# Patient Record
Sex: Female | Born: 1935 | Race: Black or African American | Hispanic: No | Marital: Married | Smoking: Never smoker
Health system: Southern US, Community
[De-identification: ages and names within clinical notes are randomized; demographics above are authoritative.]

## PROBLEM LIST (undated history)

## (undated) DIAGNOSIS — I10 Essential (primary) hypertension: Secondary | ICD-10-CM

## (undated) DIAGNOSIS — R51 Headache: Secondary | ICD-10-CM

## (undated) DIAGNOSIS — R519 Headache, unspecified: Secondary | ICD-10-CM

## (undated) DIAGNOSIS — E876 Hypokalemia: Secondary | ICD-10-CM

## (undated) DIAGNOSIS — M1990 Unspecified osteoarthritis, unspecified site: Secondary | ICD-10-CM

## (undated) HISTORY — PX: OTHER SURGICAL HISTORY: SHX169

## (undated) HISTORY — PX: NASAL SINUS SURGERY: SHX719

## (undated) HISTORY — PX: TUBAL LIGATION: SHX77

## (undated) HISTORY — PX: CATARACT EXTRACTION: SUR2

---

## 1997-05-25 ENCOUNTER — Other Ambulatory Visit: Admission: RE | Admit: 1997-05-25 | Discharge: 1997-05-25 | Payer: Self-pay | Admitting: Nephrology

## 1997-10-04 ENCOUNTER — Emergency Department (HOSPITAL_COMMUNITY): Admission: EM | Admit: 1997-10-04 | Discharge: 1997-10-04 | Payer: Self-pay | Admitting: Emergency Medicine

## 1997-10-10 ENCOUNTER — Encounter: Admission: RE | Admit: 1997-10-10 | Discharge: 1997-10-10 | Payer: Self-pay | Admitting: Hematology and Oncology

## 1997-12-18 ENCOUNTER — Encounter: Admission: RE | Admit: 1997-12-18 | Discharge: 1997-12-18 | Payer: Self-pay | Admitting: Internal Medicine

## 1998-01-18 ENCOUNTER — Encounter: Admission: RE | Admit: 1998-01-18 | Discharge: 1998-01-18 | Payer: Self-pay | Admitting: Hematology and Oncology

## 1998-02-15 ENCOUNTER — Encounter: Admission: RE | Admit: 1998-02-15 | Discharge: 1998-02-15 | Payer: Self-pay | Admitting: Internal Medicine

## 1998-02-26 ENCOUNTER — Ambulatory Visit (HOSPITAL_COMMUNITY): Admission: RE | Admit: 1998-02-26 | Discharge: 1998-02-26 | Payer: Self-pay | Admitting: *Deleted

## 1998-03-01 ENCOUNTER — Encounter: Admission: RE | Admit: 1998-03-01 | Discharge: 1998-03-01 | Payer: Self-pay | Admitting: Hematology and Oncology

## 1998-03-08 ENCOUNTER — Ambulatory Visit (HOSPITAL_COMMUNITY): Admission: RE | Admit: 1998-03-08 | Discharge: 1998-03-08 | Payer: Self-pay | Admitting: Internal Medicine

## 1998-04-06 ENCOUNTER — Encounter: Admission: RE | Admit: 1998-04-06 | Discharge: 1998-04-06 | Payer: Self-pay | Admitting: Internal Medicine

## 1998-05-09 ENCOUNTER — Encounter: Admission: RE | Admit: 1998-05-09 | Discharge: 1998-05-09 | Payer: Self-pay | Admitting: Hematology and Oncology

## 1998-05-30 ENCOUNTER — Emergency Department (HOSPITAL_COMMUNITY): Admission: EM | Admit: 1998-05-30 | Discharge: 1998-05-30 | Payer: Self-pay | Admitting: Emergency Medicine

## 1998-06-07 ENCOUNTER — Encounter: Admission: RE | Admit: 1998-06-07 | Discharge: 1998-06-07 | Payer: Self-pay | Admitting: Internal Medicine

## 1998-07-11 ENCOUNTER — Encounter: Admission: RE | Admit: 1998-07-11 | Discharge: 1998-07-11 | Payer: Self-pay | Admitting: Hematology and Oncology

## 1998-07-12 ENCOUNTER — Encounter: Admission: RE | Admit: 1998-07-12 | Discharge: 1998-07-12 | Payer: Self-pay | Admitting: Hematology and Oncology

## 1998-07-12 ENCOUNTER — Ambulatory Visit (HOSPITAL_COMMUNITY): Admission: RE | Admit: 1998-07-12 | Discharge: 1998-07-12 | Payer: Self-pay | Admitting: Hematology and Oncology

## 1998-07-26 ENCOUNTER — Encounter: Admission: RE | Admit: 1998-07-26 | Discharge: 1998-07-26 | Payer: Self-pay | Admitting: Internal Medicine

## 1998-08-21 ENCOUNTER — Encounter: Admission: RE | Admit: 1998-08-21 | Discharge: 1998-08-21 | Payer: Self-pay | Admitting: Internal Medicine

## 1998-09-04 ENCOUNTER — Encounter: Admission: RE | Admit: 1998-09-04 | Discharge: 1998-09-04 | Payer: Self-pay | Admitting: Internal Medicine

## 1998-09-25 ENCOUNTER — Encounter: Admission: RE | Admit: 1998-09-25 | Discharge: 1998-09-25 | Payer: Self-pay | Admitting: Hematology and Oncology

## 1999-01-19 ENCOUNTER — Encounter: Payer: Self-pay | Admitting: Emergency Medicine

## 1999-01-19 ENCOUNTER — Emergency Department (HOSPITAL_COMMUNITY): Admission: EM | Admit: 1999-01-19 | Discharge: 1999-01-20 | Payer: Self-pay | Admitting: Emergency Medicine

## 1999-03-09 ENCOUNTER — Emergency Department (HOSPITAL_COMMUNITY): Admission: EM | Admit: 1999-03-09 | Discharge: 1999-03-09 | Payer: Self-pay | Admitting: *Deleted

## 1999-10-21 ENCOUNTER — Encounter: Payer: Self-pay | Admitting: Emergency Medicine

## 1999-10-21 ENCOUNTER — Inpatient Hospital Stay (HOSPITAL_COMMUNITY): Admission: EM | Admit: 1999-10-21 | Discharge: 1999-10-23 | Payer: Self-pay | Admitting: Emergency Medicine

## 1999-10-25 ENCOUNTER — Encounter: Admission: RE | Admit: 1999-10-25 | Discharge: 1999-10-25 | Payer: Self-pay | Admitting: Internal Medicine

## 1999-12-26 ENCOUNTER — Emergency Department (HOSPITAL_COMMUNITY): Admission: EM | Admit: 1999-12-26 | Discharge: 1999-12-26 | Payer: Self-pay | Admitting: *Deleted

## 2000-06-27 ENCOUNTER — Emergency Department (HOSPITAL_COMMUNITY): Admission: EM | Admit: 2000-06-27 | Discharge: 2000-06-27 | Payer: Self-pay | Admitting: *Deleted

## 2000-07-24 ENCOUNTER — Encounter: Payer: Self-pay | Admitting: Emergency Medicine

## 2000-07-24 ENCOUNTER — Emergency Department (HOSPITAL_COMMUNITY): Admission: EM | Admit: 2000-07-24 | Discharge: 2000-07-24 | Payer: Self-pay | Admitting: Emergency Medicine

## 2000-08-16 ENCOUNTER — Emergency Department (HOSPITAL_COMMUNITY): Admission: EM | Admit: 2000-08-16 | Discharge: 2000-08-16 | Payer: Self-pay | Admitting: Emergency Medicine

## 2000-09-08 ENCOUNTER — Encounter: Admission: RE | Admit: 2000-09-08 | Discharge: 2000-12-07 | Payer: Self-pay | Admitting: Internal Medicine

## 2001-09-29 ENCOUNTER — Emergency Department (HOSPITAL_COMMUNITY): Admission: EM | Admit: 2001-09-29 | Discharge: 2001-09-29 | Payer: Self-pay | Admitting: Emergency Medicine

## 2001-09-29 ENCOUNTER — Encounter: Payer: Self-pay | Admitting: Emergency Medicine

## 2001-12-31 ENCOUNTER — Emergency Department (HOSPITAL_COMMUNITY): Admission: EM | Admit: 2001-12-31 | Discharge: 2001-12-31 | Payer: Self-pay

## 2002-01-07 ENCOUNTER — Emergency Department (HOSPITAL_COMMUNITY): Admission: EM | Admit: 2002-01-07 | Discharge: 2002-01-07 | Payer: Self-pay | Admitting: Emergency Medicine

## 2002-01-14 ENCOUNTER — Emergency Department (HOSPITAL_COMMUNITY): Admission: EM | Admit: 2002-01-14 | Discharge: 2002-01-14 | Payer: Self-pay | Admitting: Emergency Medicine

## 2002-05-20 ENCOUNTER — Emergency Department (HOSPITAL_COMMUNITY): Admission: EM | Admit: 2002-05-20 | Discharge: 2002-05-20 | Payer: Self-pay

## 2002-10-16 ENCOUNTER — Emergency Department (HOSPITAL_COMMUNITY): Admission: EM | Admit: 2002-10-16 | Discharge: 2002-10-16 | Payer: Self-pay | Admitting: Emergency Medicine

## 2002-10-17 ENCOUNTER — Emergency Department (HOSPITAL_COMMUNITY): Admission: EM | Admit: 2002-10-17 | Discharge: 2002-10-17 | Payer: Self-pay

## 2002-10-17 ENCOUNTER — Emergency Department (HOSPITAL_COMMUNITY): Admission: EM | Admit: 2002-10-17 | Discharge: 2002-10-17 | Payer: Self-pay | Admitting: Emergency Medicine

## 2002-10-21 ENCOUNTER — Encounter: Payer: Self-pay | Admitting: Emergency Medicine

## 2002-10-21 ENCOUNTER — Emergency Department (HOSPITAL_COMMUNITY): Admission: EM | Admit: 2002-10-21 | Discharge: 2002-10-21 | Payer: Self-pay | Admitting: Emergency Medicine

## 2002-10-30 ENCOUNTER — Emergency Department (HOSPITAL_COMMUNITY): Admission: EM | Admit: 2002-10-30 | Discharge: 2002-10-30 | Payer: Self-pay | Admitting: Emergency Medicine

## 2002-10-30 ENCOUNTER — Emergency Department (HOSPITAL_COMMUNITY): Admission: EM | Admit: 2002-10-30 | Discharge: 2002-10-30 | Payer: Self-pay | Admitting: *Deleted

## 2002-10-31 ENCOUNTER — Encounter: Payer: Self-pay | Admitting: Emergency Medicine

## 2002-10-31 ENCOUNTER — Ambulatory Visit (HOSPITAL_COMMUNITY): Admission: RE | Admit: 2002-10-31 | Discharge: 2002-10-31 | Payer: Self-pay | Admitting: Emergency Medicine

## 2002-11-03 ENCOUNTER — Emergency Department (HOSPITAL_COMMUNITY): Admission: EM | Admit: 2002-11-03 | Discharge: 2002-11-03 | Payer: Self-pay | Admitting: Emergency Medicine

## 2002-11-26 ENCOUNTER — Emergency Department (HOSPITAL_COMMUNITY): Admission: EM | Admit: 2002-11-26 | Discharge: 2002-11-26 | Payer: Self-pay | Admitting: Emergency Medicine

## 2002-11-27 ENCOUNTER — Encounter: Payer: Self-pay | Admitting: Emergency Medicine

## 2002-11-27 ENCOUNTER — Emergency Department (HOSPITAL_COMMUNITY): Admission: AD | Admit: 2002-11-27 | Discharge: 2002-11-27 | Payer: Self-pay | Admitting: Emergency Medicine

## 2003-07-05 ENCOUNTER — Ambulatory Visit (HOSPITAL_COMMUNITY): Admission: RE | Admit: 2003-07-05 | Discharge: 2003-07-05 | Payer: Self-pay | Admitting: Family Medicine

## 2003-11-27 ENCOUNTER — Ambulatory Visit: Payer: Self-pay | Admitting: Family Medicine

## 2003-12-11 ENCOUNTER — Ambulatory Visit: Payer: Self-pay | Admitting: Family Medicine

## 2003-12-25 ENCOUNTER — Ambulatory Visit: Payer: Self-pay | Admitting: *Deleted

## 2003-12-27 ENCOUNTER — Ambulatory Visit: Payer: Self-pay | Admitting: Family Medicine

## 2004-01-15 ENCOUNTER — Ambulatory Visit: Payer: Self-pay | Admitting: Family Medicine

## 2004-03-13 ENCOUNTER — Ambulatory Visit: Payer: Self-pay | Admitting: Family Medicine

## 2004-04-17 ENCOUNTER — Ambulatory Visit: Payer: Self-pay | Admitting: Family Medicine

## 2004-05-23 ENCOUNTER — Ambulatory Visit: Payer: Self-pay | Admitting: Family Medicine

## 2004-08-25 ENCOUNTER — Emergency Department (HOSPITAL_COMMUNITY): Admission: EM | Admit: 2004-08-25 | Discharge: 2004-08-25 | Payer: Self-pay | Admitting: Emergency Medicine

## 2004-08-26 ENCOUNTER — Emergency Department (HOSPITAL_COMMUNITY): Admission: EM | Admit: 2004-08-26 | Discharge: 2004-08-26 | Payer: Self-pay | Admitting: Emergency Medicine

## 2004-09-13 ENCOUNTER — Ambulatory Visit: Payer: Self-pay | Admitting: Family Medicine

## 2004-09-26 ENCOUNTER — Ambulatory Visit: Payer: Self-pay | Admitting: Internal Medicine

## 2004-09-26 ENCOUNTER — Ambulatory Visit: Payer: Self-pay | Admitting: Family Medicine

## 2004-09-27 ENCOUNTER — Encounter: Admission: RE | Admit: 2004-09-27 | Discharge: 2004-09-27 | Payer: Self-pay | Admitting: Family Medicine

## 2004-10-03 ENCOUNTER — Ambulatory Visit: Payer: Self-pay | Admitting: Family Medicine

## 2004-10-17 ENCOUNTER — Ambulatory Visit: Payer: Self-pay | Admitting: Family Medicine

## 2004-10-24 ENCOUNTER — Ambulatory Visit: Payer: Self-pay | Admitting: Family Medicine

## 2004-11-07 ENCOUNTER — Ambulatory Visit: Payer: Self-pay | Admitting: Family Medicine

## 2004-11-21 ENCOUNTER — Ambulatory Visit: Payer: Self-pay | Admitting: Family Medicine

## 2004-11-28 ENCOUNTER — Emergency Department (HOSPITAL_COMMUNITY): Admission: EM | Admit: 2004-11-28 | Discharge: 2004-11-28 | Payer: Self-pay | Admitting: Emergency Medicine

## 2005-01-21 ENCOUNTER — Ambulatory Visit: Payer: Self-pay | Admitting: Family Medicine

## 2005-02-28 ENCOUNTER — Emergency Department (HOSPITAL_COMMUNITY): Admission: EM | Admit: 2005-02-28 | Discharge: 2005-02-28 | Payer: Self-pay | Admitting: *Deleted

## 2005-03-06 ENCOUNTER — Ambulatory Visit: Payer: Self-pay | Admitting: Family Medicine

## 2005-05-22 ENCOUNTER — Ambulatory Visit: Payer: Self-pay | Admitting: Family Medicine

## 2005-06-25 ENCOUNTER — Ambulatory Visit: Payer: Self-pay | Admitting: Family Medicine

## 2006-01-05 ENCOUNTER — Ambulatory Visit: Payer: Self-pay | Admitting: Family Medicine

## 2006-04-25 ENCOUNTER — Emergency Department (HOSPITAL_COMMUNITY): Admission: EM | Admit: 2006-04-25 | Discharge: 2006-04-25 | Payer: Self-pay | Admitting: Emergency Medicine

## 2006-05-05 ENCOUNTER — Emergency Department (HOSPITAL_COMMUNITY): Admission: EM | Admit: 2006-05-05 | Discharge: 2006-05-05 | Payer: Self-pay | Admitting: Emergency Medicine

## 2006-05-19 ENCOUNTER — Ambulatory Visit: Payer: Self-pay | Admitting: Family Medicine

## 2006-06-02 ENCOUNTER — Emergency Department (HOSPITAL_COMMUNITY): Admission: EM | Admit: 2006-06-02 | Discharge: 2006-06-02 | Payer: Self-pay | Admitting: Emergency Medicine

## 2006-07-07 ENCOUNTER — Emergency Department (HOSPITAL_COMMUNITY): Admission: EM | Admit: 2006-07-07 | Discharge: 2006-07-07 | Payer: Self-pay | Admitting: Emergency Medicine

## 2006-08-15 DIAGNOSIS — N959 Unspecified menopausal and perimenopausal disorder: Secondary | ICD-10-CM | POA: Insufficient documentation

## 2006-08-15 DIAGNOSIS — I10 Essential (primary) hypertension: Secondary | ICD-10-CM

## 2006-08-15 DIAGNOSIS — J309 Allergic rhinitis, unspecified: Secondary | ICD-10-CM | POA: Insufficient documentation

## 2006-08-15 DIAGNOSIS — R519 Headache, unspecified: Secondary | ICD-10-CM | POA: Insufficient documentation

## 2006-08-15 DIAGNOSIS — R51 Headache: Secondary | ICD-10-CM

## 2006-08-15 DIAGNOSIS — F411 Generalized anxiety disorder: Secondary | ICD-10-CM

## 2006-08-15 DIAGNOSIS — M47812 Spondylosis without myelopathy or radiculopathy, cervical region: Secondary | ICD-10-CM

## 2006-08-15 DIAGNOSIS — L659 Nonscarring hair loss, unspecified: Secondary | ICD-10-CM | POA: Insufficient documentation

## 2006-09-01 ENCOUNTER — Ambulatory Visit: Payer: Self-pay | Admitting: Family Medicine

## 2006-09-15 ENCOUNTER — Ambulatory Visit: Payer: Self-pay | Admitting: Family Medicine

## 2007-04-08 ENCOUNTER — Emergency Department (HOSPITAL_COMMUNITY): Admission: EM | Admit: 2007-04-08 | Discharge: 2007-04-08 | Payer: Self-pay | Admitting: Emergency Medicine

## 2007-04-15 ENCOUNTER — Ambulatory Visit: Payer: Self-pay | Admitting: Family Medicine

## 2007-08-17 ENCOUNTER — Emergency Department (HOSPITAL_COMMUNITY): Admission: EM | Admit: 2007-08-17 | Discharge: 2007-08-17 | Payer: Self-pay | Admitting: Emergency Medicine

## 2007-09-10 ENCOUNTER — Ambulatory Visit: Payer: Self-pay | Admitting: Family Medicine

## 2007-09-10 LAB — CONVERTED CEMR LAB
ALT: 15 units/L (ref 0–35)
AST: 15 units/L (ref 0–37)
Albumin: 5 g/dL (ref 3.5–5.2)
Alkaline Phosphatase: 63 units/L (ref 39–117)
BUN: 11 mg/dL (ref 6–23)
Basophils Absolute: 0 10*3/uL (ref 0.0–0.1)
Basophils Relative: 0 % (ref 0–1)
CO2: 24 meq/L (ref 19–32)
Calcium: 10 mg/dL (ref 8.4–10.5)
Chloride: 99 meq/L (ref 96–112)
Cholesterol: 289 mg/dL — ABNORMAL HIGH (ref 0–200)
Creatinine, Ser: 1.08 mg/dL (ref 0.40–1.20)
Eosinophils Absolute: 0.1 10*3/uL (ref 0.0–0.7)
Eosinophils Relative: 1 % (ref 0–5)
Free Thyroxine Index: 1.9 (ref 1.0–3.9)
Glucose, Bld: 85 mg/dL (ref 70–99)
HCT: 39.7 % (ref 36.0–46.0)
HDL: 83 mg/dL (ref >39)
Hemoglobin: 12.9 g/dL (ref 12.0–15.0)
LDL Cholesterol: 171 mg/dL — ABNORMAL HIGH (ref 0–99)
Lymphocytes Relative: 25 % (ref 12–46)
Lymphs Abs: 1.8 10*3/uL (ref 0.7–4.0)
MCHC: 32.5 g/dL (ref 30.0–36.0)
MCV: 96.4 fL (ref 78.0–100.0)
Monocytes Absolute: 0.4 10*3/uL (ref 0.1–1.0)
Monocytes Relative: 6 % (ref 3–12)
Neutro Abs: 4.7 10*3/uL (ref 1.7–7.7)
Neutrophils Relative %: 67 % (ref 43–77)
Platelets: 218 10*3/uL (ref 150–400)
Potassium: 3.6 meq/L (ref 3.5–5.3)
RBC: 4.12 M/uL (ref 3.87–5.11)
RDW: 14.1 % (ref 11.5–15.5)
Sed Rate: 10 mm/hr (ref 0–22)
Sodium: 139 meq/L (ref 135–145)
T3 Uptake Ratio: 27.9 % (ref 22.5–37.0)
T4, Total: 6.9 ug/dL (ref 5.0–12.5)
TSH: 1.341 microintl units/mL (ref 0.350–5.50)
Total Bilirubin: 0.5 mg/dL (ref 0.3–1.2)
Total CHOL/HDL Ratio: 3.5
Total Protein: 7.8 g/dL (ref 6.0–8.3)
Triglycerides: 176 mg/dL — ABNORMAL HIGH (ref <150)
VLDL: 35 mg/dL (ref 0–40)
WBC: 7 10*3/uL (ref 4.0–10.5)

## 2007-11-10 ENCOUNTER — Ambulatory Visit: Payer: Self-pay | Admitting: Family Medicine

## 2007-11-10 LAB — CONVERTED CEMR LAB
ALT: 21 units/L (ref 0–35)
Cholesterol: 191 mg/dL (ref 0–200)
HDL: 93 mg/dL (ref >39)
LDL Cholesterol: 75 mg/dL (ref 0–99)
Total CHOL/HDL Ratio: 2.1
Triglycerides: 114 mg/dL (ref <150)
VLDL: 23 mg/dL (ref 0–40)

## 2008-08-15 ENCOUNTER — Ambulatory Visit: Payer: Self-pay | Admitting: Family Medicine

## 2008-08-15 LAB — CONVERTED CEMR LAB: Microalb, Ur: 1.3 mg/dL (ref 0.00–1.89)

## 2008-09-20 ENCOUNTER — Emergency Department (HOSPITAL_COMMUNITY): Admission: EM | Admit: 2008-09-20 | Discharge: 2008-09-20 | Payer: Self-pay | Admitting: Emergency Medicine

## 2008-11-07 ENCOUNTER — Ambulatory Visit: Payer: Self-pay | Admitting: Family Medicine

## 2008-11-07 LAB — CONVERTED CEMR LAB
ALT: 11 units/L (ref 0–35)
AST: 12 units/L (ref 0–37)
Albumin: 4.6 g/dL (ref 3.5–5.2)
Alkaline Phosphatase: 56 units/L (ref 39–117)
BUN: 10 mg/dL (ref 6–23)
CO2: 23 meq/L (ref 19–32)
Calcium: 9.9 mg/dL (ref 8.4–10.5)
Chloride: 101 meq/L (ref 96–112)
Cholesterol: 259 mg/dL — ABNORMAL HIGH (ref 0–200)
Creatinine, Ser: 1.1 mg/dL (ref 0.40–1.20)
Glucose, Bld: 101 mg/dL — ABNORMAL HIGH (ref 70–99)
HDL: 85 mg/dL (ref >39)
LDL Cholesterol: 143 mg/dL — ABNORMAL HIGH (ref 0–99)
Potassium: 3.3 meq/L — ABNORMAL LOW (ref 3.5–5.3)
Sed Rate: 2 mm/hr (ref 0–22)
Sodium: 139 meq/L (ref 135–145)
TSH: 2.742 microintl units/mL (ref 0.350–4.500)
Total Bilirubin: 0.3 mg/dL (ref 0.3–1.2)
Total CHOL/HDL Ratio: 3
Total Protein: 7 g/dL (ref 6.0–8.3)
Triglycerides: 157 mg/dL — ABNORMAL HIGH (ref <150)
VLDL: 31 mg/dL (ref 0–40)
Vit D, 25-Hydroxy: 14 ng/mL — ABNORMAL LOW (ref 30–89)

## 2008-11-16 ENCOUNTER — Other Ambulatory Visit: Admission: RE | Admit: 2008-11-16 | Discharge: 2008-11-16 | Payer: Self-pay | Admitting: Family Medicine

## 2008-11-16 ENCOUNTER — Encounter (INDEPENDENT_AMBULATORY_CARE_PROVIDER_SITE_OTHER): Payer: Self-pay | Admitting: Family Medicine

## 2008-11-16 ENCOUNTER — Ambulatory Visit: Payer: Self-pay | Admitting: Family Medicine

## 2008-11-24 ENCOUNTER — Ambulatory Visit (HOSPITAL_COMMUNITY): Admission: RE | Admit: 2008-11-24 | Discharge: 2008-11-24 | Payer: Self-pay | Admitting: Family Medicine

## 2009-01-15 ENCOUNTER — Ambulatory Visit: Payer: Self-pay | Admitting: Family Medicine

## 2009-01-16 ENCOUNTER — Encounter (INDEPENDENT_AMBULATORY_CARE_PROVIDER_SITE_OTHER): Payer: Self-pay | Admitting: Family Medicine

## 2009-01-16 LAB — CONVERTED CEMR LAB
ALT: 11 units/L (ref 0–35)
BUN: 9 mg/dL (ref 6–23)
CO2: 26 meq/L (ref 19–32)
Calcium: 9.8 mg/dL (ref 8.4–10.5)
Chloride: 90 meq/L — ABNORMAL LOW (ref 96–112)
Cholesterol: 248 mg/dL — ABNORMAL HIGH (ref 0–200)
Creatinine, Ser: 1.05 mg/dL (ref 0.40–1.20)
Glucose, Bld: 111 mg/dL — ABNORMAL HIGH (ref 70–99)
HDL: 88 mg/dL (ref >39)
LDL Cholesterol: 138 mg/dL — ABNORMAL HIGH (ref 0–99)
Potassium: 3.2 meq/L — ABNORMAL LOW (ref 3.5–5.3)
Sodium: 130 meq/L — ABNORMAL LOW (ref 135–145)
Total CHOL/HDL Ratio: 2.8
Triglycerides: 110 mg/dL (ref <150)
VLDL: 22 mg/dL (ref 0–40)

## 2010-06-23 LAB — CBC
HCT: 35.9 % — ABNORMAL LOW (ref 36.0–46.0)
Hemoglobin: 12.5 g/dL (ref 12.0–15.0)
MCHC: 34.7 g/dL (ref 30.0–36.0)
MCV: 95.5 fL (ref 78.0–100.0)
Platelets: 155 10*3/uL (ref 150–400)
RBC: 3.76 MIL/uL — ABNORMAL LOW (ref 3.87–5.11)
RDW: 13.4 % (ref 11.5–15.5)
WBC: 5.3 10*3/uL (ref 4.0–10.5)

## 2010-06-23 LAB — URINE MICROSCOPIC-ADD ON

## 2010-06-23 LAB — COMPREHENSIVE METABOLIC PANEL
ALT: 15 U/L (ref 0–35)
AST: 20 U/L (ref 0–37)
Albumin: 4.4 g/dL (ref 3.5–5.2)
Alkaline Phosphatase: 58 U/L (ref 39–117)
BUN: 14 mg/dL (ref 6–23)
CO2: 30 mEq/L (ref 19–32)
Calcium: 9.7 mg/dL (ref 8.4–10.5)
Chloride: 98 mEq/L (ref 96–112)
Creatinine, Ser: 1.34 mg/dL — ABNORMAL HIGH (ref 0.4–1.2)
GFR calc Af Amer: 46 mL/min — ABNORMAL LOW (ref >60)
GFR calc non Af Amer: 38 mL/min — ABNORMAL LOW (ref >60)
Glucose, Bld: 153 mg/dL — ABNORMAL HIGH (ref 70–99)
Potassium: 3.1 mEq/L — ABNORMAL LOW (ref 3.5–5.1)
Sodium: 135 mEq/L (ref 135–145)
Total Bilirubin: 0.6 mg/dL (ref 0.3–1.2)
Total Protein: 7.1 g/dL (ref 6.0–8.3)

## 2010-06-23 LAB — DIFFERENTIAL
Basophils Absolute: 0 10*3/uL (ref 0.0–0.1)
Basophils Relative: 0 % (ref 0–1)
Eosinophils Absolute: 0.1 10*3/uL (ref 0.0–0.7)
Eosinophils Relative: 1 % (ref 0–5)
Lymphocytes Relative: 22 % (ref 12–46)
Lymphs Abs: 1.2 10*3/uL (ref 0.7–4.0)
Monocytes Absolute: 0.4 10*3/uL (ref 0.1–1.0)
Monocytes Relative: 8 % (ref 3–12)
Neutro Abs: 3.7 10*3/uL (ref 1.7–7.7)
Neutrophils Relative %: 69 % (ref 43–77)

## 2010-06-23 LAB — URINALYSIS, ROUTINE W REFLEX MICROSCOPIC
Bilirubin Urine: NEGATIVE
Glucose, UA: NEGATIVE mg/dL
Hgb urine dipstick: NEGATIVE
Ketones, ur: NEGATIVE mg/dL
Nitrite: NEGATIVE
Protein, ur: NEGATIVE mg/dL
Specific Gravity, Urine: 1.008 (ref 1.005–1.030)
Urobilinogen, UA: 0.2 mg/dL (ref 0.0–1.0)
pH: 7 (ref 5.0–8.0)

## 2010-07-06 ENCOUNTER — Emergency Department (HOSPITAL_COMMUNITY): Payer: Medicare Other

## 2010-07-06 ENCOUNTER — Emergency Department (HOSPITAL_COMMUNITY)
Admission: EM | Admit: 2010-07-06 | Discharge: 2010-07-07 | Disposition: A | Discharge status: Home / Self Care | Payer: Medicare Other | Attending: Emergency Medicine | Admitting: Emergency Medicine

## 2010-07-06 DIAGNOSIS — E7800 Pure hypercholesterolemia, unspecified: Secondary | ICD-10-CM | POA: Insufficient documentation

## 2010-07-06 DIAGNOSIS — I10 Essential (primary) hypertension: Secondary | ICD-10-CM | POA: Insufficient documentation

## 2010-07-06 DIAGNOSIS — R635 Abnormal weight gain: Secondary | ICD-10-CM | POA: Insufficient documentation

## 2010-07-06 DIAGNOSIS — F411 Generalized anxiety disorder: Secondary | ICD-10-CM | POA: Insufficient documentation

## 2010-07-06 DIAGNOSIS — R609 Edema, unspecified: Secondary | ICD-10-CM | POA: Insufficient documentation

## 2010-07-06 LAB — COMPREHENSIVE METABOLIC PANEL
CO2: 29 mEq/L (ref 19–32)
Calcium: 9.5 mg/dL (ref 8.4–10.5)
Creatinine, Ser: 1.57 mg/dL — ABNORMAL HIGH (ref 0.4–1.2)
GFR calc Af Amer: 38 mL/min — ABNORMAL LOW (ref >60)
GFR calc non Af Amer: 32 mL/min — ABNORMAL LOW (ref >60)
Glucose, Bld: 107 mg/dL — ABNORMAL HIGH (ref 70–99)

## 2010-07-06 LAB — DIFFERENTIAL
Basophils Absolute: 0 10*3/uL (ref 0.0–0.1)
Basophils Relative: 1 % (ref 0–1)
Monocytes Absolute: 0.6 10*3/uL (ref 0.1–1.0)
Neutro Abs: 3.1 10*3/uL (ref 1.7–7.7)

## 2010-07-06 LAB — URINALYSIS, ROUTINE W REFLEX MICROSCOPIC
Ketones, ur: NEGATIVE mg/dL
Nitrite: NEGATIVE
Protein, ur: NEGATIVE mg/dL

## 2010-07-06 LAB — POCT CARDIAC MARKERS
Myoglobin, poc: 61.1 ng/mL (ref 12–200)
Troponin i, poc: <0.05 ng/mL (ref 0.00–0.09)

## 2010-07-06 LAB — CBC
Hemoglobin: 12 g/dL (ref 12.0–15.0)
MCHC: 34.1 g/dL (ref 30.0–36.0)
RDW: 12.9 % (ref 11.5–15.5)
WBC: 6 10*3/uL (ref 4.0–10.5)

## 2010-07-06 LAB — PROTIME-INR: Prothrombin Time: 12.8 seconds (ref 11.6–15.2)

## 2010-07-06 LAB — APTT: aPTT: 37 seconds (ref 24–37)

## 2010-07-09 LAB — URINE CULTURE: Culture  Setup Time: 201204221125

## 2010-08-02 NOTE — Discharge Summary (Signed)
[place]. [facility]  Patient:    Adrienne Bright, Adrienne Bright                     MRN: [id number] Adm. Date:  [id number] Disc. Date: [id number] Attending:  Madaline Guthrie Dictator:   Blanch Media, M.D.                           Discharge Summary  RESIDENTS: 1. ______. 2. Blanch Media, M.D.  DISCHARGE DIAGNOSES: DD:  11/08/99 TD:  11/11/99 Job: [id number]

## 2010-09-19 ENCOUNTER — Encounter: Payer: Medicare Other | Attending: Neurosurgery | Admitting: Dietician

## 2010-09-19 DIAGNOSIS — I10 Essential (primary) hypertension: Secondary | ICD-10-CM | POA: Insufficient documentation

## 2010-09-19 DIAGNOSIS — Z713 Dietary counseling and surveillance: Secondary | ICD-10-CM | POA: Insufficient documentation

## 2010-09-19 DIAGNOSIS — E785 Hyperlipidemia, unspecified: Secondary | ICD-10-CM | POA: Insufficient documentation

## 2012-06-08 ENCOUNTER — Emergency Department (HOSPITAL_COMMUNITY)
Admission: EM | Admit: 2012-06-08 | Discharge: 2012-06-08 | Disposition: A | Discharge status: Home / Self Care | Payer: Medicare Other | Attending: Emergency Medicine | Admitting: Emergency Medicine

## 2012-06-08 ENCOUNTER — Emergency Department (HOSPITAL_COMMUNITY): Payer: Medicare Other

## 2012-06-08 DIAGNOSIS — R05 Cough: Secondary | ICD-10-CM

## 2012-06-08 DIAGNOSIS — R059 Cough, unspecified: Secondary | ICD-10-CM | POA: Insufficient documentation

## 2012-06-08 DIAGNOSIS — E876 Hypokalemia: Secondary | ICD-10-CM | POA: Insufficient documentation

## 2012-06-08 DIAGNOSIS — Z79899 Other long term (current) drug therapy: Secondary | ICD-10-CM | POA: Insufficient documentation

## 2012-06-08 DIAGNOSIS — R509 Fever, unspecified: Secondary | ICD-10-CM | POA: Insufficient documentation

## 2012-06-08 LAB — BASIC METABOLIC PANEL
BUN: 9 mg/dL (ref 6–23)
CO2: 27 mEq/L (ref 19–32)
Calcium: 9.5 mg/dL (ref 8.4–10.5)
Creatinine, Ser: 0.88 mg/dL (ref 0.50–1.10)

## 2012-06-08 LAB — CBC
MCH: 31.6 pg (ref 26.0–34.0)
MCV: 92.8 fL (ref 78.0–100.0)
Platelets: 208 10*3/uL (ref 150–400)
RBC: 4.02 MIL/uL (ref 3.87–5.11)

## 2012-06-08 MED ORDER — GUAIFENESIN ER 1200 MG PO TB12: 1.0000 | ORAL_TABLET | Freq: Two times a day (BID) | ORAL | Status: DC

## 2012-06-08 MED ORDER — POTASSIUM CHLORIDE CRYS ER 20 MEQ PO TBCR
40.0000 meq | EXTENDED_RELEASE_TABLET | Freq: Once | ORAL | Status: AC
  Administered 2012-06-08: 40 meq via ORAL
  Filled 2012-06-08: qty 2

## 2012-06-08 MED ORDER — BENZONATATE 100 MG PO CAPS: 100.0000 mg | ORAL_CAPSULE | Freq: Three times a day (TID) | ORAL | Status: DC

## 2012-06-08 NOTE — ED Provider Notes (Signed)
History     CSN: [id number]  Arrival date & time 06/08/12  1740   First MD Initiated Contact with Patient 06/08/12 1821      Chief Complaint  Patient presents with  . Cough    Patient is a 79 y.o. female presenting with cough. The history is provided by the patient.  Cough Cough characteristics:  Dry Severity:  Moderate Onset quality:  Gradual Duration: The symptoms started this morning. Timing:  Intermittent Chronicity:  New Smoker: no   Relieved by:  Nothing Worsened by:  Nothing tried Associated symptoms: chills and fever   Associated symptoms: no rash and no shortness of breath   SHe did not check her temperature today but she felt like she had a fever.  When she coughs her chest does burn.  No history of DVT, PE or heart failure.    No past medical history on file.  No past surgical history on file.  No family history on file.  History  Substance Use Topics  . Smoking status: Not on file  . Smokeless tobacco: Not on file  . Alcohol Use: Not on file    OB History   No data available      Review of Systems  Constitutional: Positive for fever and chills.  Respiratory: Positive for cough. Negative for shortness of breath.   Skin: Negative for rash.  All other systems reviewed and are negative.    Allergies  Amlodipine besylate; Benazepril hcl; Propranolol hcl; and Trandolapril  Home Medications   Current Outpatient Rx  Name  Route  Sig  Dispense  Refill  . amLODipine (NORVASC) 10 MG tablet   Oral   Take 10 mg by mouth every morning.         . butalbital-acetaminophen-caffeine (FIORICET, ESGIC) 50-325-40 MG per tablet   Oral   Take 1 tablet by mouth 2 (two) times daily as needed for headache (headache).         . diazepam (VALIUM) 5 MG tablet   Oral   Take 2.5-5 mg by mouth every 6 (six) hours as needed (anxiety).         . furosemide (LASIX) 20 MG tablet   Oral   Take 20 mg by mouth every morning.         Marland Kitchen lisinopril  (PRINIVIL,ZESTRIL) 20 MG tablet   Oral   Take 20 mg by mouth every morning.         . metoprolol (LOPRESSOR) 50 MG tablet   Oral   Take 50 mg by mouth 2 (two) times daily.         . benzonatate (TESSALON) 100 MG capsule   Oral   Take 1 capsule (100 mg total) by mouth every 8 (eight) hours.   21 capsule   0   . Guaifenesin 1200 MG TB12   Oral   Take 1 tablet (1,200 mg total) by mouth 2 (two) times daily at 10 AM and 5 PM.   14 each   0     BP 174/80  Pulse 86  Temp(Src) 98.6 F (37 C) (Oral)  Resp 19  SpO2 100%  Physical Exam  Nursing note and vitals reviewed. Constitutional: She appears well-developed and well-nourished. No distress.  HENT:  Head: Normocephalic and atraumatic.  Right Ear: External ear normal.  Left Ear: External ear normal.  Eyes: Conjunctivae are normal. Right eye exhibits no discharge. Left eye exhibits no discharge. No scleral icterus.  Neck: Neck supple. No tracheal deviation present.  Cardiovascular: Normal rate, regular rhythm and intact distal pulses.   Pulmonary/Chest: Effort normal and breath sounds normal. No stridor. No respiratory distress. She has no wheezes. She has no rales.  Abdominal: Soft. Bowel sounds are normal. She exhibits no distension. There is no tenderness. There is no rebound and no guarding.  Musculoskeletal: She exhibits no edema and no tenderness.  Neurological: She is alert. She has normal strength. No sensory deficit. Cranial nerve deficit:  no gross defecits noted. She exhibits normal muscle tone. She displays no seizure activity. Coordination normal.  Skin: Skin is warm and dry. No rash noted.  Psychiatric: She has a normal mood and affect.    ED Course  Procedures (including critical care time) Monitor Sinus rhythm, rate 79, nl intervals, nl t waves, narrow qrs  Medications  potassium chloride SA (K-DUR,KLOR-CON) CR tablet 40 mEq (not administered)    Labs Reviewed  CBC - Abnormal; Notable for the  following:    WBC 11.7 (*)    All other components within normal limits  BASIC METABOLIC PANEL - Abnormal; Notable for the following:    Potassium 3.1 (*)    Glucose, Bld 102 (*)    GFR calc non Af Amer 60 (*)    GFR calc Af Amer 69 (*)    All other components within normal limits   Dg Chest 2 View  06/08/2012  *RADIOLOGY REPORT*  Clinical Data: Cough and congestion.  CHEST - 2 VIEW  Comparison: 07/06/2010  Findings: The cardiac silhouette, mediastinal and hilar contours are normal and stable.  There is tortuosity and calcification of the thoracic aorta.  The lungs are clear.  No pleural effusion. The bony thorax is intact.  IMPRESSION: No acute cardiopulmonary findings.   Original Report Authenticated By: Rudie Meyer, M.D.      1. Cough   2. Hypokalemia       MDM  Pt appears well in the ED.  No coughing, dyspnea or fever.  Suspect viral etiology.  Will treat with guaifenisin, tessalon.  Close follow up with PCP.  Precautions and warning signs discussed.       Celene Kras, MD 06/08/12 2004

## 2012-06-08 NOTE — ED Notes (Addendum)
Patient reports cough starting this morning. HA from cough, "burning in throat". Patient reports having chills and sweats. Patient reports feeling febrile but never taking temperature. Patient reports taking Fioricet for HA

## 2012-06-23 IMAGING — CR DG CHEST 2V
2 series · 2 of 2 positions shown · non-contrast
Comparison: Chest radiograph performed 09/20/2008

CLINICAL DATA: Weight gain, sensation of bloating and bilateral
lower leg swelling.

CHEST - 2 VIEW

[w chest pa]
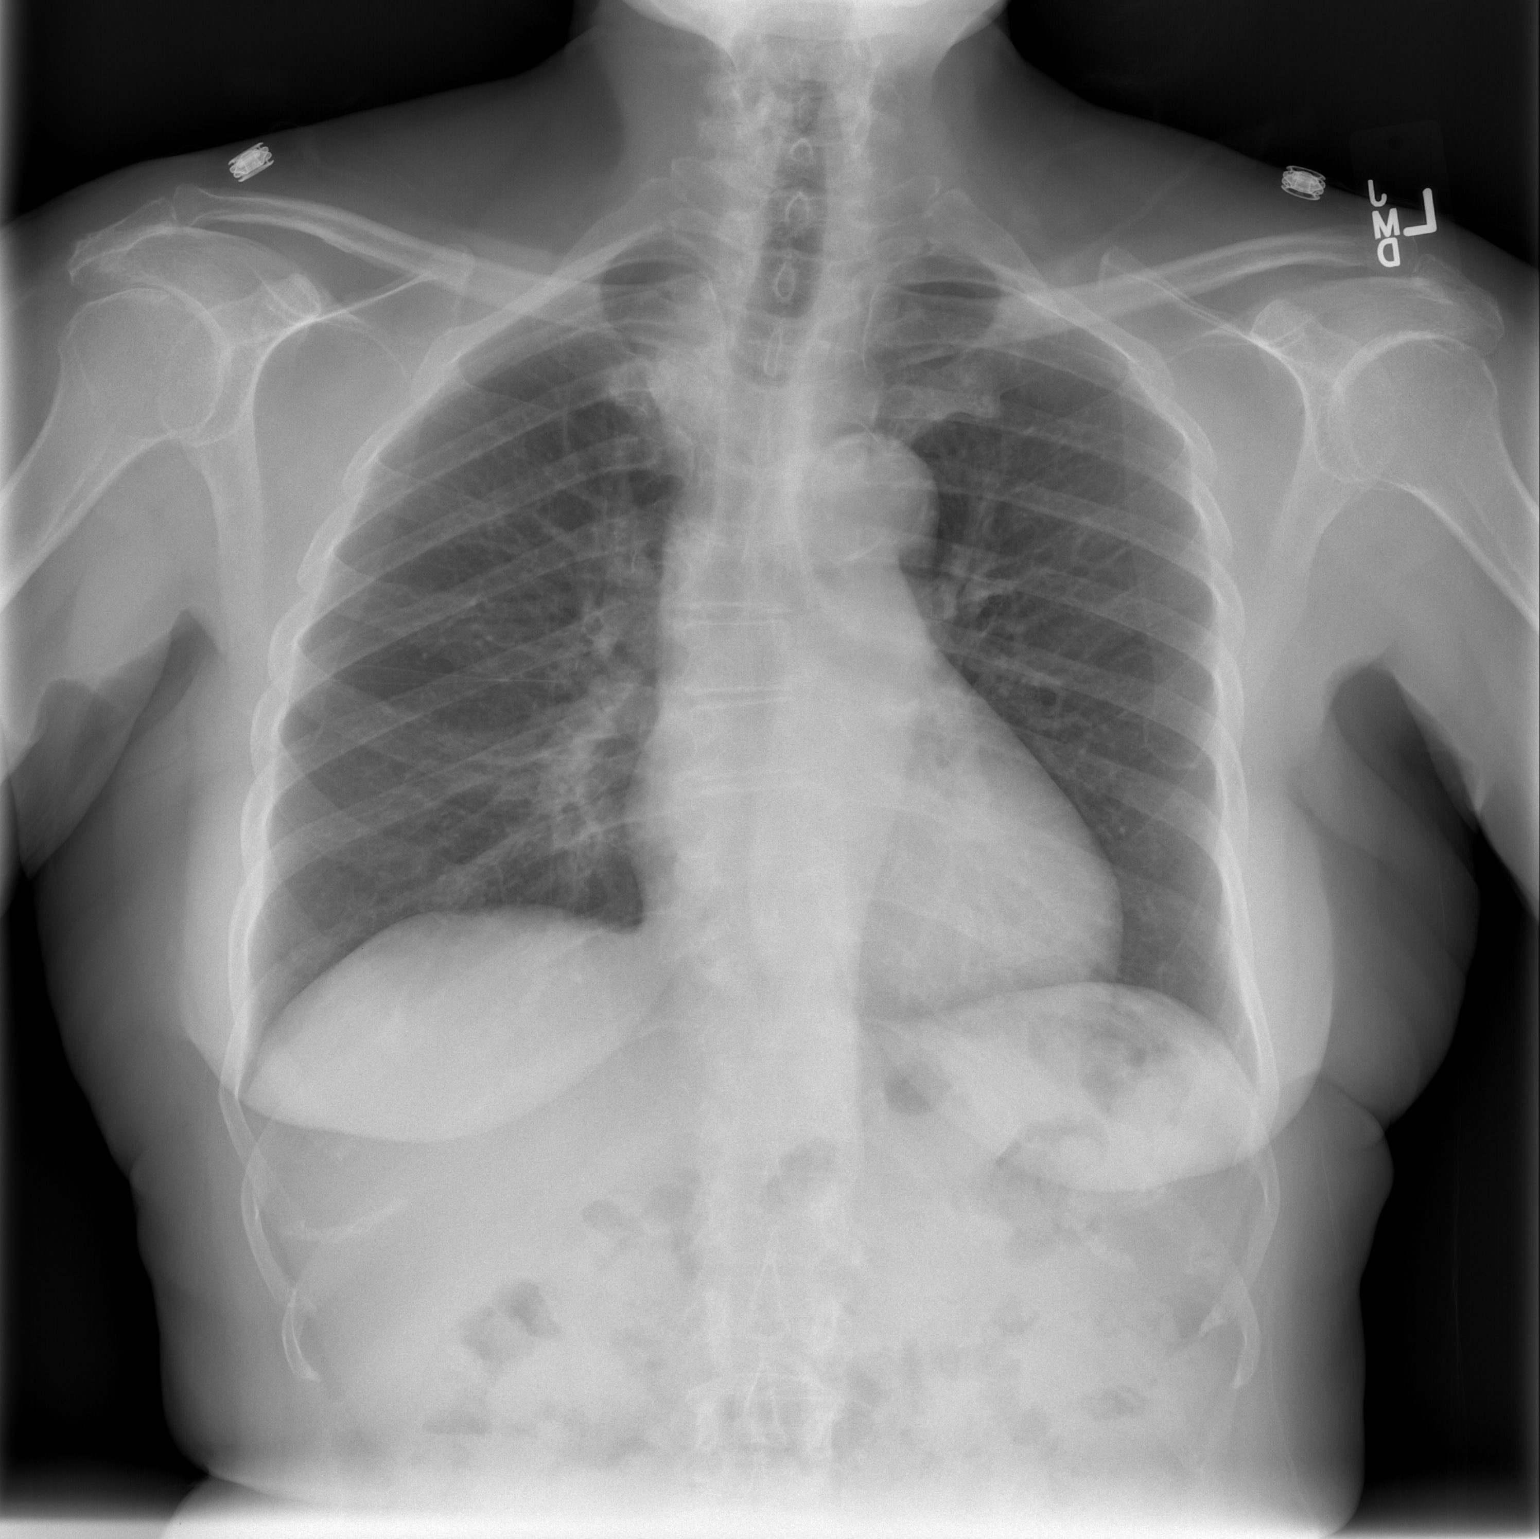

[w chest lat]
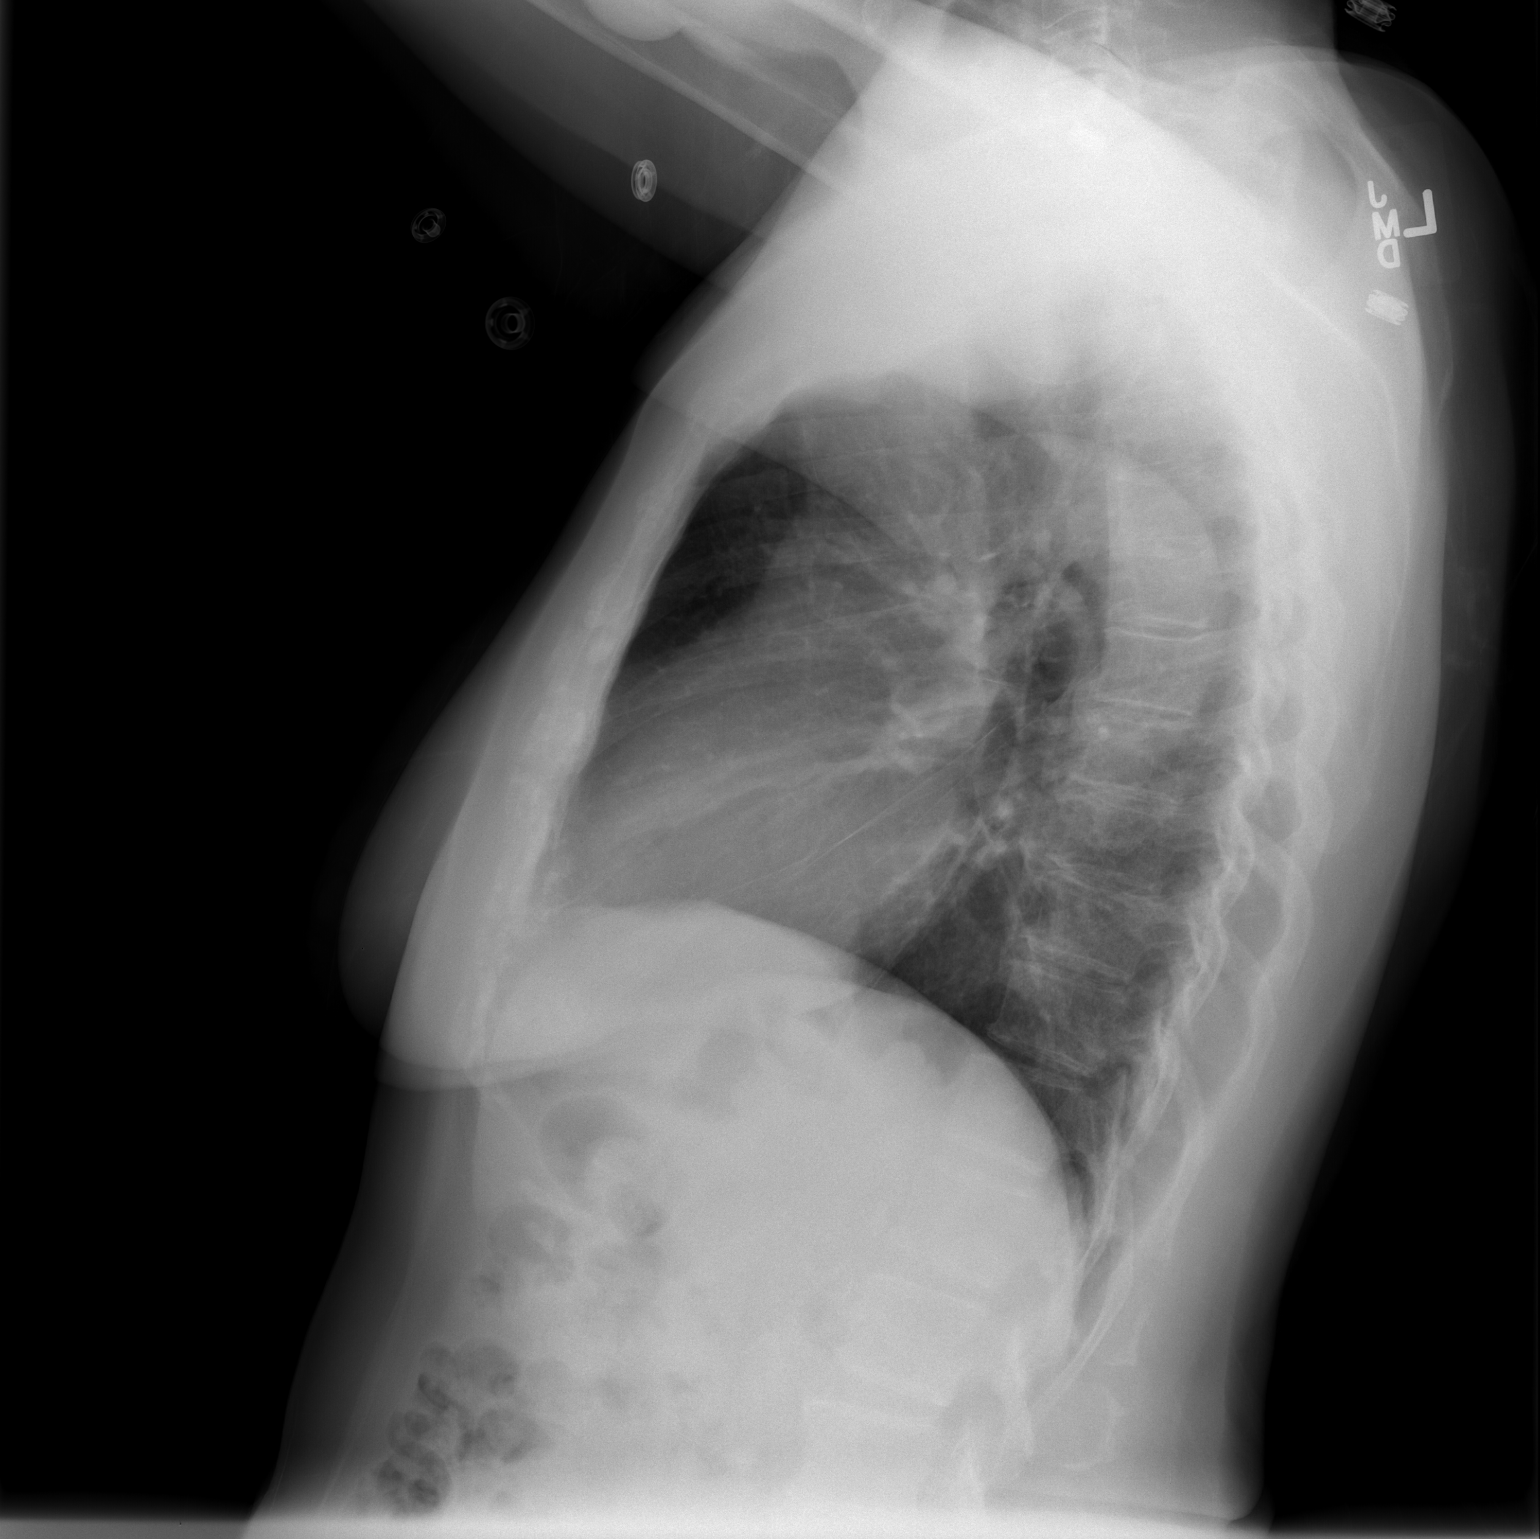

[2 of 2 positions shown; findings below may reference images not displayed]

FINDINGS: The lungs are well-aerated.  Peribronchial thickening is
noted.  There is no evidence of focal opacification, pleural
effusion or pneumothorax.

The heart is normal in size; calcification is noted within the
aortic arch.  Stable prominence of the right paratracheal soft
tissues likely reflects vasculature.  No acute osseous
abnormalities are seen.
IMPRESSION: Peribronchial thickening; lungs otherwise clear.

## 2013-08-13 ENCOUNTER — Emergency Department (HOSPITAL_COMMUNITY)
Admission: EM | Admit: 2013-08-13 | Discharge: 2013-08-13 | Disposition: A | Discharge status: Home / Self Care | Payer: Medicare Other | Attending: Emergency Medicine | Admitting: Emergency Medicine

## 2013-08-13 ENCOUNTER — Emergency Department (HOSPITAL_COMMUNITY): Payer: Medicare Other

## 2013-08-13 ENCOUNTER — Encounter (HOSPITAL_COMMUNITY): Payer: Self-pay | Admitting: Emergency Medicine

## 2013-08-13 DIAGNOSIS — Z8639 Personal history of other endocrine, nutritional and metabolic disease: Secondary | ICD-10-CM | POA: Insufficient documentation

## 2013-08-13 DIAGNOSIS — Z79899 Other long term (current) drug therapy: Secondary | ICD-10-CM | POA: Insufficient documentation

## 2013-08-13 DIAGNOSIS — E876 Hypokalemia: Secondary | ICD-10-CM

## 2013-08-13 DIAGNOSIS — Z862 Personal history of diseases of the blood and blood-forming organs and certain disorders involving the immune mechanism: Secondary | ICD-10-CM | POA: Insufficient documentation

## 2013-08-13 DIAGNOSIS — I10 Essential (primary) hypertension: Secondary | ICD-10-CM | POA: Insufficient documentation

## 2013-08-13 DIAGNOSIS — R600 Localized edema: Secondary | ICD-10-CM

## 2013-08-13 DIAGNOSIS — R609 Edema, unspecified: Secondary | ICD-10-CM | POA: Insufficient documentation

## 2013-08-13 HISTORY — DX: Headache, unspecified: R51.9

## 2013-08-13 HISTORY — DX: Hypercalcemia: E83.52

## 2013-08-13 HISTORY — DX: Essential (primary) hypertension: I10

## 2013-08-13 HISTORY — DX: Hypokalemia: E87.6

## 2013-08-13 HISTORY — DX: Headache: R51

## 2013-08-13 LAB — COMPREHENSIVE METABOLIC PANEL
ALK PHOS: 87 U/L (ref 39–117)
ALT: 10 U/L (ref 0–35)
AST: 14 U/L (ref 0–37)
Albumin: 4.8 g/dL (ref 3.5–5.2)
BILIRUBIN TOTAL: 0.4 mg/dL (ref 0.3–1.2)
BUN: 10 mg/dL (ref 6–23)
CO2: 25 meq/L (ref 19–32)
Calcium: 10.1 mg/dL (ref 8.4–10.5)
Chloride: 95 mEq/L — ABNORMAL LOW (ref 96–112)
Creatinine, Ser: 0.91 mg/dL (ref 0.50–1.10)
GFR, EST AFRICAN AMERICAN: 66 mL/min — AB (ref >90)
GFR, EST NON AFRICAN AMERICAN: 57 mL/min — AB (ref >90)
GLUCOSE: 97 mg/dL (ref 70–99)
POTASSIUM: 3 meq/L — AB (ref 3.7–5.3)
SODIUM: 134 meq/L — AB (ref 137–147)
Total Protein: 7.9 g/dL (ref 6.0–8.3)

## 2013-08-13 LAB — CBC
HEMATOCRIT: 38.8 % (ref 36.0–46.0)
HEMOGLOBIN: 12.7 g/dL (ref 12.0–15.0)
MCH: 31.1 pg (ref 26.0–34.0)
MCHC: 32.7 g/dL (ref 30.0–36.0)
MCV: 94.9 fL (ref 78.0–100.0)
Platelets: 190 10*3/uL (ref 150–400)
RBC: 4.09 MIL/uL (ref 3.87–5.11)
RDW: 13.5 % (ref 11.5–15.5)
WBC: 6.5 10*3/uL (ref 4.0–10.5)

## 2013-08-13 LAB — TROPONIN I: Troponin I: <0.3 ng/mL (ref <0.30)

## 2013-08-13 LAB — PRO B NATRIURETIC PEPTIDE: Pro B Natriuretic peptide (BNP): 115.3 pg/mL (ref 0–450)

## 2013-08-13 MED ORDER — POTASSIUM CHLORIDE CRYS ER 20 MEQ PO TBCR
40.0000 meq | EXTENDED_RELEASE_TABLET | Freq: Once | ORAL | Status: AC
Start: 2013-08-13 — End: 2013-08-13
  Administered 2013-08-13: 40 meq via ORAL
  Filled 2013-08-13: qty 2

## 2013-08-13 NOTE — ED Provider Notes (Signed)
CSN: [id number]     Arrival date & time 08/13/13  1440 History   First MD Initiated Contact with Patient 08/13/13 1537     Chief Complaint  Patient presents with  . Leg Swelling     (Consider location/radiation/quality/duration/timing/severity/associated sxs/prior Treatment) The history is provided by the patient.  pt c/o bilateral ankle and lower leg swelling for the past 2-3 months, worse in past couple weeks. Constant. No acute/abrupt change today. Bilateral, no worsening unilateral swelling, or leg pain. Hx same. States takes lasix for same but doesn't think is helping. Is urinating of normal frequency. No orthopnea or pnd. No current or recent cp or discomfort.  Denies recent change in meds or doses, states compliant w normal meds. Pt unaware of recent wt change. No recent change in diet/foods.  Denies hx liver disease or kidney disease.     Past Medical History  Diagnosis Date  . Hypertension   . Hypercalcemia   . Headache   . Hypokalemia    History reviewed. No pertinent past surgical history. No family history on file. History  Substance Use Topics  . Smoking status: Not on file  . Smokeless tobacco: Current User    Types: Snuff  . Alcohol Use: No   OB History   Grav Para Term Preterm Abortions TAB SAB Ect Mult Living                 Review of Systems  Constitutional: Negative for fever and chills.  HENT: Negative for sore throat.   Eyes: Negative for redness.  Respiratory: Negative for cough and shortness of breath.   Cardiovascular: Positive for leg swelling. Negative for chest pain.  Gastrointestinal: Negative for vomiting, abdominal pain and diarrhea.  Genitourinary: Negative for flank pain.  Musculoskeletal: Negative for back pain and neck pain.  Skin: Negative for rash.  Neurological: Negative for headaches.  Hematological: Does not bruise/bleed easily.  Psychiatric/Behavioral: Negative for confusion.      Allergies  Amlodipine besylate; Benazepril  hcl; Propranolol hcl; and Trandolapril  Home Medications   Prior to Admission medications   Medication Sig Start Date End Date Taking? Authorizing Provider  amLODipine (NORVASC) 10 MG tablet Take 10 mg by mouth every morning.    Historical Provider, MD  benzonatate (TESSALON) 100 MG capsule Take 1 capsule (100 mg total) by mouth every 8 (eight) hours. 06/08/12   Dorie Rank, MD  butalbital-acetaminophen-caffeine (FIORICET, ESGIC) [phone] MG per tablet Take 1 tablet by mouth 2 (two) times daily as needed for headache (headache).    Historical Provider, MD  diazepam (VALIUM) 5 MG tablet Take 2.5-5 mg by mouth every 6 (six) hours as needed (anxiety).    Historical Provider, MD  furosemide (LASIX) 20 MG tablet Take 20 mg by mouth every morning.    Historical Provider, MD  Guaifenesin 1200 MG TB12 Take 1 tablet (1,200 mg total) by mouth 2 (two) times daily at 10 AM and 5 PM. 06/08/12   Dorie Rank, MD  lisinopril (PRINIVIL,ZESTRIL) 20 MG tablet Take 20 mg by mouth every morning.    Historical Provider, MD  metoprolol (LOPRESSOR) 50 MG tablet Take 50 mg by mouth 2 (two) times daily.    Historical Provider, MD   BP 152/84  Pulse 115  Temp(Src) 98.7 F (37.1 C) (Oral)  Resp 18  SpO2 96% Physical Exam  Nursing note and vitals reviewed. Constitutional: She is oriented to person, place, and time. She appears well-developed and well-nourished. No distress.  HENT:  Mouth/Throat: Oropharynx  is clear and moist.  Eyes: Conjunctivae are normal. No scleral icterus.  Neck: Neck supple. No JVD present. No tracheal deviation present.  Cardiovascular: Normal rate, regular rhythm, normal heart sounds and intact distal pulses.   Pulmonary/Chest: Effort normal and breath sounds normal. No respiratory distress.  Abdominal: Soft. Normal appearance and bowel sounds are normal. She exhibits no distension and no mass. There is no tenderness. There is no rebound and no guarding.  Genitourinary:  No cva tenderness   Musculoskeletal: Normal range of motion. She exhibits edema. She exhibits no tenderness.  1-2+ bilateral, symmetric, foot/ankle/lower leg edema. Distal pulses palp.   Neurological: She is alert and oriented to person, place, and time.  Skin: Skin is warm and dry. No rash noted.  Psychiatric: She has a normal mood and affect.    ED Course  Procedures (including critical care time) Labs Review  Results for orders placed during the hospital encounter of 08/13/13  CBC      Result Value Ref Range   WBC 6.5  4.0 - 10.5 K/uL   RBC 4.09  3.87 - 5.11 MIL/uL   Hemoglobin 12.7  12.0 - 15.0 g/dL   HCT 38.8  36.0 - 46.0 %   MCV 94.9  78.0 - 100.0 fL   MCH 31.1  26.0 - 34.0 pg   MCHC 32.7  30.0 - 36.0 g/dL   RDW 13.5  11.5 - 15.5 %   Platelets 190  150 - 400 K/uL  COMPREHENSIVE METABOLIC PANEL      Result Value Ref Range   Sodium 134 (*) 137 - 147 mEq/L   Potassium 3.0 (*) 3.7 - 5.3 mEq/L   Chloride 95 (*) 96 - 112 mEq/L   CO2 25  19 - 32 mEq/L   Glucose, Bld 97  70 - 99 mg/dL   BUN 10  6 - 23 mg/dL   Creatinine, Ser 0.91  0.50 - 1.10 mg/dL   Calcium 10.1  8.4 - 10.5 mg/dL   Total Protein 7.9  6.0 - 8.3 g/dL   Albumin 4.8  3.5 - 5.2 g/dL   AST 14  0 - 37 U/L   ALT 10  0 - 35 U/L   Alkaline Phosphatase 87  39 - 117 U/L   Total Bilirubin 0.4  0.3 - 1.2 mg/dL   GFR calc non Af Amer 57 (*) >90 mL/min   GFR calc Af Amer 66 (*) >90 mL/min  PRO B NATRIURETIC PEPTIDE      Result Value Ref Range   Pro B Natriuretic peptide (BNP) 115.3  0 - 450 pg/mL  TROPONIN I      Result Value Ref Range   Troponin I <0.30  <0.30 ng/mL   Dg Chest 2 View  08/13/2013   CLINICAL DATA:  pain chest pain  EXAM: CHEST  2 VIEW  COMPARISON:  06/08/2012  FINDINGS: Normal cardiac silhouette. There is a density along the right paratracheal upper lobe which is not changed from prior likely represents a benign vascular structure. No effusion, infiltrate, pneumothorax.  IMPRESSION: No acute cardiopulmonary process.  .   No change from prior.   Electronically Signed   By: Suzy Bouchard M.D.   On: 08/13/2013 17:04       EKG Interpretation   Date/Time:  Saturday Aug 13 2013 16:10:45 EDT Ventricular Rate:  89 PR Interval:  168 QRS Duration: 88 QT Interval:  336 QTC Calculation: 409 R Axis:   11 Text Interpretation:  Sinus rhythm Multiform  ventricular premature  complexes Nonspecific T abnormalities, lateral leads No significant change  since last tracing Confirmed by Ashok Cordia  MD, Lennette Bihari ([id number]) on 08/13/2013  4:31:47 PM      MDM  Labs. Cxr.  Reviewed nursing notes and prior charts for additional history.   Mild symmetric bil leg edema x months. No evidence chf.  k low, low in past, kcl po.  Pt no k supplement, will increase and have recheck 1 week.  Discussed leg elevation, limiting salt intake, and close pcp f/u.  Pt appears stable for d/c.      Mirna Mires, MD 08/13/13 [phone]

## 2013-08-13 NOTE — ED Notes (Signed)
Pt. Stated, I've been having leg swelling for 2 weeks, my Dr. Hanley Seamen me some water pills but they don't seem to work.

## 2013-08-13 NOTE — Discharge Instructions (Signed)
Your potassium level is low (3) - eat plenty of fruit and vegetables.  Take one extra of your potassium supplement pills each day for the next week.  Follow up with your doctor for recheck of your potassium level in 1 week. For leg swelling, continue your lasix, elevated legs, and limit salt intake.   Follow up with your doctor in coming week. Return to ER if worse, trouble breathing, worsening or severe leg swelling, one-sided leg swelling and pain, other concern.     Peripheral Edema You have swelling in your legs (peripheral edema). This swelling is due to excess accumulation of salt and water in your body. Edema may be a sign of heart, kidney or liver disease, or a side effect of a medication. It may also be due to problems in the leg veins. Elevating your legs and using special support stockings may be very helpful, if the cause of the swelling is due to poor venous circulation. Avoid long periods of standing, whatever the cause. Treatment of edema depends on identifying the cause. Chips, pretzels, pickles and other salty foods should be avoided. Restricting salt in your diet is almost always needed. Water pills (diuretics) are often used to remove the excess salt and water from your body via urine. These medicines prevent the kidney from reabsorbing sodium. This increases urine flow. Diuretic treatment may also result in lowering of potassium levels in your body. Potassium supplements may be needed if you have to use diuretics daily. Daily weights can help you keep track of your progress in clearing your edema. You should call your caregiver for follow up care as recommended. SEEK IMMEDIATE MEDICAL CARE IF:   You have increased swelling, pain, redness, or heat in your legs.  You develop shortness of breath, especially when lying down.  You develop chest or abdominal pain, weakness, or fainting.  You have a fever. Document Released: 04/10/2004 Document Revised: 05/26/2011 Document Reviewed:  03/21/2009 [facility] Patient Information 2014 [place].      Hypokalemia Hypokalemia means that the amount of potassium in the blood is lower than normal.Potassium is a chemical, called an electrolyte, that helps regulate the amount of fluid in the body. It also stimulates muscle contraction and helps nerves function properly.Most of the body's potassium is inside of cells, and only a very small amount is in the blood. Because the amount in the blood is so small, minor changes can be life-threatening. CAUSES  Antibiotics.  Diarrhea or vomiting.  Using laxatives too much, which can cause diarrhea.  Chronic kidney disease.  Water pills (diuretics).  Eating disorders (bulimia).  Low magnesium level.  Sweating a lot. SIGNS AND SYMPTOMS  Weakness.  Constipation.  Fatigue.  Muscle cramps.  Mental confusion.  Skipped heartbeats or irregular heartbeat (palpitations).  Tingling or numbness. DIAGNOSIS  Your health care provider can diagnose hypokalemia with blood tests. In addition to checking your potassium level, your health care provider may also check other lab tests. TREATMENT Hypokalemia can be treated with potassium supplements taken by mouth or adjustments in your current medicines. If your potassium level is very low, you may need to get potassium through a vein (IV) and be monitored in the hospital. A diet high in potassium is also helpful. Foods high in potassium are:  Nuts, such as peanuts and pistachios.  Seeds, such as sunflower seeds and pumpkin seeds.  Peas, lentils, and lima beans.  Whole grain and bran cereals and breads.  Fresh fruit and vegetables, such as apricots, avocado,  bananas, cantaloupe, kiwi, oranges, tomatoes, asparagus, and potatoes.  Orange and tomato juices.  Red meats.  Fruit yogurt. HOME CARE INSTRUCTIONS  Take all medicines as prescribed by your health care provider.  Maintain a healthy diet by including nutritious  food, such as fruits, vegetables, nuts, whole grains, and lean meats.  If you are taking a laxative, be sure to follow the directions on the label. SEEK MEDICAL CARE IF:  Your weakness gets worse.  You feel your heart pounding or racing.  You are vomiting or having diarrhea.  You are diabetic and having trouble keeping your blood glucose in the normal range. SEEK IMMEDIATE MEDICAL CARE IF:  You have chest pain, shortness of breath, or dizziness.  You are vomiting or having diarrhea for more than 2 days.  You faint. MAKE SURE YOU:   Understand these instructions.  Will watch your condition.  Will get help right away if you are not doing well or get worse. Document Released: 03/03/2005 Document Revised: 12/22/2012 Document Reviewed: 09/03/2012 [facility] Patient Information 2014 [place].    Edema Edema is a buildup of fluids. It is most common in the feet, ankles, and legs. This happens more as a person ages. It may affect one or both legs. HOME CARE   Raise (elevate) the legs or ankles above the level of the heart while lying down.  Avoid sitting or standing still for a long time.  Exercise the legs to help the puffiness (swelling) go down.  A low-salt diet may help lessen the puffiness.  Only take medicine as told by your doctor. GET HELP RIGHT AWAY IF:   You develop shortness of breath or chest pain.  You cannot breathe when you lie down.  You have more puffiness that does not go away with treatment.  You develop pain or redness in the areas that are puffy.  You have a temperature by mouth above 102 F (38.9 C), not controlled by medicine.  You gain 03 lb/1.4 kg or more in 1 day or 05 lb/2.3 kg in a week. MAKE SURE YOU:   Understand these instructions.  Will watch your condition.  Will get help right away if you are not doing well or get worse. Document Released: 08/20/2007 Document Revised: 05/26/2011 Document Reviewed: 08/20/2007 [facility]  Patient Information 2014 [place], [place].

## 2013-08-21 ENCOUNTER — Encounter (HOSPITAL_COMMUNITY): Payer: Self-pay | Admitting: Emergency Medicine

## 2013-08-21 ENCOUNTER — Emergency Department (HOSPITAL_COMMUNITY)
Admission: EM | Admit: 2013-08-21 | Discharge: 2013-08-21 | Disposition: A | Discharge status: Home / Self Care | Payer: Medicare Other | Attending: Emergency Medicine | Admitting: Emergency Medicine

## 2013-08-21 DIAGNOSIS — F17200 Nicotine dependence, unspecified, uncomplicated: Secondary | ICD-10-CM | POA: Insufficient documentation

## 2013-08-21 DIAGNOSIS — I10 Essential (primary) hypertension: Secondary | ICD-10-CM | POA: Insufficient documentation

## 2013-08-21 DIAGNOSIS — Z79899 Other long term (current) drug therapy: Secondary | ICD-10-CM | POA: Insufficient documentation

## 2013-08-21 DIAGNOSIS — R21 Rash and other nonspecific skin eruption: Secondary | ICD-10-CM | POA: Insufficient documentation

## 2013-08-21 DIAGNOSIS — Z888 Allergy status to other drugs, medicaments and biological substances status: Secondary | ICD-10-CM | POA: Insufficient documentation

## 2013-08-21 DIAGNOSIS — M7989 Other specified soft tissue disorders: Secondary | ICD-10-CM | POA: Insufficient documentation

## 2013-08-21 DIAGNOSIS — E876 Hypokalemia: Secondary | ICD-10-CM | POA: Insufficient documentation

## 2013-08-21 NOTE — ED Provider Notes (Signed)
CSN: [id number]     Arrival date & time 08/21/13  4128 History   First MD Initiated Contact with Patient 08/21/13 1005     Chief Complaint  Patient presents with  . Rash  . Leg Swelling     (Consider location/radiation/quality/duration/timing/severity/associated sxs/prior Treatment) HPI  80 year old female with history of hypertension, hyperglycemia, and hypokalemia who presents complaining of a rash.  Patient reports for the past 4 days she has noticed gradual onset of a rash throughout body. The rash initially was first noticed on her neck and now it is everywhere. She described it as multiple red spots throughout her body. Rash is mildly itchy but not painful. No associated fever, headache, throat swelling, chest pain, shortness of breath, wheezing, nausea vomiting or diarrhea. No medication changes, no new pets, no new detergent or soap, and no other change in environment that patient can recall. She has never had this rash before. No one else at home with similar rash. No specific treatment tried. Patient also mentioned that she has bilateral lower extremity swelling ongoing for the past 2 weeks. States she was seen by her primary care Dr. and was placed on a short course of Lasix. She does not think that the leg swelling improved with the medication and therefore she did visit the ER 8 days ago for the same complaints. At that time an evaluation was done but no evidence of CHF, or kidney insufficiency. Patient was recommended to follow with her primary care Dr. Today she makes brief mention of the leg swelling but states that it has not bothered her.  Past Medical History  Diagnosis Date  . Hypertension   . Hypercalcemia   . Headache   . Hypokalemia    History reviewed. No pertinent past surgical history. History reviewed. No pertinent family history. History  Substance Use Topics  . Smoking status: Not on file  . Smokeless tobacco: Current User    Types: Snuff  . Alcohol Use: No    OB History   Grav Para Term Preterm Abortions TAB SAB Ect Mult Living                 Review of Systems  All other systems reviewed and are negative.     Allergies  Amlodipine besylate; Benazepril hcl; Propranolol hcl; and Trandolapril  Home Medications   Prior to Admission medications   Medication Sig Start Date End Date Taking? Authorizing Provider  acetaminophen (TYLENOL) 325 MG tablet Take 650 mg by mouth every 6 (six) hours as needed for mild pain.   Yes Historical Provider, MD  amLODipine (NORVASC) 10 MG tablet Take 5 mg by mouth every morning.    Yes Historical Provider, MD  butalbital-acetaminophen-caffeine (FIORICET, ESGIC) 50-325-40 MG per tablet Take 1 tablet by mouth 2 (two) times daily as needed for headache.   Yes Historical Provider, MD  calcium-vitamin D (OSCAL 500/200 D-3) 500-200 MG-UNIT per tablet Take 1 tablet by mouth 2 (two) times daily.   Yes Historical Provider, MD  diazepam (VALIUM) 5 MG tablet Take 2.5 mg by mouth 2 (two) times daily as needed for anxiety (anxiety).    Yes Historical Provider, MD  furosemide (LASIX) 20 MG tablet Take 20 mg by mouth daily as needed for fluid.    Yes Historical Provider, MD  lisinopril (PRINIVIL,ZESTRIL) 20 MG tablet Take 40 mg by mouth every morning.    Yes Historical Provider, MD  loratadine (CLARITIN) 10 MG tablet Take 10 mg by mouth daily as needed for  allergies.   Yes Historical Provider, MD  metoprolol (LOPRESSOR) 50 MG tablet Take 50 mg by mouth 2 (two) times daily.   Yes Historical Provider, MD  mometasone (NASONEX) 50 MCG/ACT nasal spray Place 2 sprays into the nose daily as needed (allergies).   Yes Historical Provider, MD  omeprazole (PRILOSEC) 20 MG capsule Take 20 mg by mouth daily.   Yes Historical Provider, MD  potassium chloride (MICRO-K) 10 MEQ CR capsule Take 10 mEq by mouth daily. When taking Furosemide   Yes Historical Provider, MD  pravastatin (PRAVACHOL) 40 MG tablet Take 40 mg by mouth daily.   Yes  Historical Provider, MD   BP 164/90  Pulse 78  Temp(Src) 98.8 F (37.1 C) (Oral)  SpO2 98% Physical Exam  Nursing note and vitals reviewed. Constitutional: She is oriented to person, place, and time. She appears well-developed and well-nourished. No distress.  HENT:  Head: Atraumatic.  Mouth/Throat: Oropharynx is clear and moist.  Eyes: Conjunctivae are normal.  Neck: Neck supple.  No nuchal rigidity  Cardiovascular: Normal rate and regular rhythm.   Pulmonary/Chest: Effort normal and breath sounds normal. She has no rales.  Abdominal: Soft. There is no tenderness.  Musculoskeletal: She exhibits no edema and no tenderness.  Neurological: She is alert and oriented to person, place, and time.  Skin: Rash (multiple macular erythematous lesion measuring less than 1 cm are present throughout the body including her neck, upper chest, mid back, bilateral thigh, bilateral forearms. No mucosal involvement and no rash in palms of hands or soles of feet) noted.  Psychiatric: She has a normal mood and affect.    ED Course  Procedures (including critical care time)  Patient with macular erythematous lesions throughout her body. No red flags. No petechiae, pustule, or vesicular lesion noted. No signs of infection. No systemic involvement. No urticaria hives. I suspect the rash will be self-limiting and therefore recommend watchful waiting and followup with her PCP. I believe prednisone, Benadryl, and Pepcid has greater side effect as compared to the benefit that it provides. I recommend using hydrocortisone 1% cream on lesions that bothers her the most but avoid face. Today patient has no evidence of pulmonary edema, no JVD, and no lower extremity swelling. She has a fairly unremarkable workup weeks ago for the pedal edema. Given that she was on Lasix recently, this medication may have caused a rash. Recommend stop taking Lasix and discussed this with her primary care Dr.  Care discussed with Dr.  Tawnya Crook who agrees with plan.    Labs Review Labs Reviewed - No data to display  Imaging Review No results found.   EKG Interpretation None      MDM   Final diagnoses:  Rash and nonspecific skin eruption    BP 130/66  Pulse 61  Temp(Src) 98.8 F (37.1 C) (Oral)  SpO2 99%  I have reviewed nursing notes and vital signs.  I reviewed available ER/hospitalization records thought the EMR     Domenic Moras, [place] 08/21/13 1205

## 2013-08-21 NOTE — Discharge Instructions (Signed)
Please monitor your rash closely.  If you develop fever, throat swelling, trouble breathing then return to ER for further evaluation of your rash.  Otherwise follow up with your doctor.  You may use over the counter hydrocortisone 1% cream and apply to rash as need for itch.  Stop taking Lasix as this may be the offending agent.    Rash A rash is a change in the color or texture of your skin. There are many different types of rashes. You may have other problems that accompany your rash. CAUSES   Infections.  Allergic reactions. This can include allergies to pets or foods.  Certain medicines.  Exposure to certain chemicals, soaps, or cosmetics.  Heat.  Exposure to poisonous plants.  Tumors, both cancerous and noncancerous. SYMPTOMS   Redness.  Scaly skin.  Itchy skin.  Dry or cracked skin.  Bumps.  Blisters.  Pain. DIAGNOSIS  Your caregiver may do a physical exam to determine what type of rash you have. A skin sample (biopsy) may be taken and examined under a microscope. TREATMENT  Treatment depends on the type of rash you have. Your caregiver may prescribe certain medicines. For serious conditions, you may need to see a skin doctor (dermatologist). HOME CARE INSTRUCTIONS   Avoid the substance that caused your rash.  Do not scratch your rash. This can cause infection.  You may take cool baths to help stop itching.  Only take over-the-counter or prescription medicines as directed by your caregiver.  Keep all follow-up appointments as directed by your caregiver. SEEK IMMEDIATE MEDICAL CARE IF:  You have increasing pain, swelling, or redness.  You have a fever.  You have new or severe symptoms.  You have body aches, diarrhea, or vomiting.  Your rash is not better after 3 days. MAKE SURE YOU:  Understand these instructions.  Will watch your condition.  Will get help right away if you are not doing well or get worse. Document Released: 02/21/2002 Document  Revised: 05/26/2011 Document Reviewed: 12/16/2010 [facility] Patient Information 2014 [place], [place].

## 2013-08-21 NOTE — ED Notes (Addendum)
She states her legs are swollen, she was here last week for the same thing and the swelling has not gotten any better. She also noticed this week she has a rash all over her body that itches. Denies any new meds or products. No one in house with same rash.

## 2013-08-22 NOTE — ED Provider Notes (Signed)
Medical screening examination/treatment/procedure(s) were conducted as a shared visit with non-physician practitioner(s) and myself.  I personally evaluated the patient during the encounter. Pt presents w/ pruritic rash, found to have few scattered papules on extremities, neck/trunk. No signs of systemic infection, no clear environmental causes. She has not tried anything on rash. These may be insect bites such as bed bugs. Rec supportive care w/ PO benadyl or lotion, washing of fomites, close PCP f/u.     EKG Interpretation None        Neta Ehlers, MD 08/22/13 1431

## 2013-09-23 ENCOUNTER — Emergency Department (HOSPITAL_COMMUNITY)
Admission: EM | Admit: 2013-09-23 | Discharge: 2013-09-23 | Disposition: A | Discharge status: Home / Self Care | Payer: Medicare Other | Attending: Emergency Medicine | Admitting: Emergency Medicine

## 2013-09-23 ENCOUNTER — Encounter (HOSPITAL_COMMUNITY): Payer: Self-pay | Admitting: Emergency Medicine

## 2013-09-23 DIAGNOSIS — Z8639 Personal history of other endocrine, nutritional and metabolic disease: Secondary | ICD-10-CM | POA: Diagnosis not present

## 2013-09-23 DIAGNOSIS — S90569A Insect bite (nonvenomous), unspecified ankle, initial encounter: Secondary | ICD-10-CM | POA: Insufficient documentation

## 2013-09-23 DIAGNOSIS — Y9389 Activity, other specified: Secondary | ICD-10-CM | POA: Insufficient documentation

## 2013-09-23 DIAGNOSIS — Y92009 Unspecified place in unspecified non-institutional (private) residence as the place of occurrence of the external cause: Secondary | ICD-10-CM | POA: Insufficient documentation

## 2013-09-23 DIAGNOSIS — S1096XA Insect bite of unspecified part of neck, initial encounter: Secondary | ICD-10-CM | POA: Diagnosis not present

## 2013-09-23 DIAGNOSIS — W57XXXA Bitten or stung by nonvenomous insect and other nonvenomous arthropods, initial encounter: Secondary | ICD-10-CM | POA: Insufficient documentation

## 2013-09-23 DIAGNOSIS — Z79899 Other long term (current) drug therapy: Secondary | ICD-10-CM | POA: Insufficient documentation

## 2013-09-23 DIAGNOSIS — Z862 Personal history of diseases of the blood and blood-forming organs and certain disorders involving the immune mechanism: Secondary | ICD-10-CM | POA: Insufficient documentation

## 2013-09-23 DIAGNOSIS — L089 Local infection of the skin and subcutaneous tissue, unspecified: Secondary | ICD-10-CM | POA: Insufficient documentation

## 2013-09-23 DIAGNOSIS — I10 Essential (primary) hypertension: Secondary | ICD-10-CM | POA: Insufficient documentation

## 2013-09-23 DIAGNOSIS — T148 Other injury of unspecified body region: Principal | ICD-10-CM

## 2013-09-23 MED ORDER — HYDROXYZINE HCL 25 MG PO TABS: 25.0000 mg | ORAL_TABLET | Freq: Four times a day (QID) | ORAL | Status: DC

## 2013-09-23 NOTE — ED Provider Notes (Signed)
CSN: [id number]     Arrival date & time 09/23/13  1324 History   First MD Initiated Contact with Patient 09/23/13 1411     Chief Complaint  Patient presents with  . Animal Bite    Bed BUGS     (Consider location/radiation/quality/duration/timing/severity/associated sxs/prior Treatment) HPI Comments: Patient is an 80 year old female who presents with complaints of multiple insect bites to the arms, legs, and neck.  She gets these when she goes to her husband's house.  She brought two of these with her in a medication bottle.  No difficulty breathing or swallowing.  No fevers or chills.  Patient is a 80 y.o. female presenting with animal bite. The history is provided by the patient.  Animal Bite Contact animal:  Insect Location:  Head/neck and shoulder/arm   Past Medical History  Diagnosis Date  . Hypertension   . Hypercalcemia   . Headache   . Hypokalemia    History reviewed. No pertinent past surgical history. No family history on file. History  Substance Use Topics  . Smoking status: Never Smoker   . Smokeless tobacco: Current User    Types: Snuff  . Alcohol Use: No   OB History   Grav Para Term Preterm Abortions TAB SAB Ect Mult Living                 Review of Systems  All other systems reviewed and are negative.     Allergies  Amlodipine besylate; Benazepril hcl; Propranolol hcl; and Trandolapril  Home Medications   Prior to Admission medications   Medication Sig Start Date End Date Taking? Authorizing Provider  acetaminophen (TYLENOL) 325 MG tablet Take 650 mg by mouth every 6 (six) hours as needed for mild pain.    Historical Provider, MD  amLODipine (NORVASC) 10 MG tablet Take 5 mg by mouth every morning.     Historical Provider, MD  butalbital-acetaminophen-caffeine (FIORICET, ESGIC) 50-325-40 MG per tablet Take 1 tablet by mouth 2 (two) times daily as needed for headache.    Historical Provider, MD  calcium-vitamin D (OSCAL 500/200 D-3) 500-200 MG-UNIT  per tablet Take 1 tablet by mouth 2 (two) times daily.    Historical Provider, MD  diazepam (VALIUM) 5 MG tablet Take 2.5 mg by mouth 2 (two) times daily as needed for anxiety (anxiety).     Historical Provider, MD  furosemide (LASIX) 20 MG tablet Take 20 mg by mouth daily as needed for fluid.     Historical Provider, MD  lisinopril (PRINIVIL,ZESTRIL) 20 MG tablet Take 40 mg by mouth every morning.     Historical Provider, MD  loratadine (CLARITIN) 10 MG tablet Take 10 mg by mouth daily as needed for allergies.    Historical Provider, MD  metoprolol (LOPRESSOR) 50 MG tablet Take 50 mg by mouth 2 (two) times daily.    Historical Provider, MD  mometasone (NASONEX) 50 MCG/ACT nasal spray Place 2 sprays into the nose daily as needed (allergies).    Historical Provider, MD  omeprazole (PRILOSEC) 20 MG capsule Take 20 mg by mouth daily.    Historical Provider, MD  potassium chloride (MICRO-K) 10 MEQ CR capsule Take 10 mEq by mouth daily. When taking Furosemide    Historical Provider, MD  pravastatin (PRAVACHOL) 40 MG tablet Take 40 mg by mouth daily.    Historical Provider, MD   BP 163/66  Pulse 102  Temp(Src) 98.3 F (36.8 C) (Oral)  Resp 20  SpO2 97% Physical Exam  Nursing note  and vitals reviewed. Constitutional: She is oriented to person, place, and time. She appears well-developed and well-nourished. No distress.  HENT:  Head: Normocephalic and atraumatic.  Mouth/Throat: Oropharynx is clear and moist.  Neck: Normal range of motion. Neck supple.  Cardiovascular: Normal rate, regular rhythm and normal heart sounds.   No murmur heard. Pulmonary/Chest: Effort normal and breath sounds normal. She exhibits no tenderness.  Abdominal: Soft. Bowel sounds are normal.  Neurological: She is alert and oriented to person, place, and time.  Skin: Skin is warm and dry. She is not diaphoretic.  There are multiple, small, raised, pruritic lesions to the arms, legs, and neck.      ED Course  Procedures  (including critical care time) Labs Review Labs Reviewed - No data to display  Imaging Review No results found.   EKG Interpretation None      MDM   Final diagnoses:  None    Will treat with hydroxyzine, prn follow up.  Bugs don't appear to be bedbugs, are not ticks.    Veryl Speak, MD 09/23/13 1501

## 2013-09-23 NOTE — ED Notes (Signed)
Pt states that she has being bite while sleeping and has brought in these 2 little bugs and appears to be bedbugs.  Pt states it makes her break out in red spots.  Pt states it makes her body hurt and feels weak

## 2013-09-23 NOTE — Discharge Instructions (Signed)
Atarax as needed for itching.  Return to the ER for difficulty breathing, increased redness or swelling, or other new and concerning symptoms.   Insect Bite Mosquitoes, flies, fleas, bedbugs, and many other insects can bite. Insect bites are different from insect stings. A sting is when venom is injected into the skin. Some insect bites can transmit infectious diseases. SYMPTOMS  Insect bites usually turn red, swell, and itch for 2 to 4 days. They often go away on their own. TREATMENT  Your caregiver may prescribe antibiotic medicines if a bacterial infection develops in the bite. HOME CARE INSTRUCTIONS  Do not scratch the bite area.  Keep the bite area clean and dry. Wash the bite area thoroughly with soap and water.  Put ice or cool compresses on the bite area.  Put ice in a plastic bag.  Place a towel between your skin and the bag.  Leave the ice on for 20 minutes, 4 times a day for the first 2 to 3 days, or as directed.  You may apply a baking soda paste, cortisone cream, or calamine lotion to the bite area as directed by your caregiver. This can help reduce itching and swelling.  Only take over-the-counter or prescription medicines as directed by your caregiver.  If you are given antibiotics, take them as directed. Finish them even if you start to feel better. You may need a tetanus shot if:  You cannot remember when you had your last tetanus shot.  You have never had a tetanus shot.  The injury broke your skin. If you get a tetanus shot, your arm may swell, get red, and feel warm to the touch. This is common and not a problem. If you need a tetanus shot and you choose not to have one, there is a rare chance of getting tetanus. Sickness from tetanus can be serious. SEEK IMMEDIATE MEDICAL CARE IF:   You have increased pain, redness, or swelling in the bite area.  You see a red line on the skin coming from the bite.  You have a fever.  You have joint pain.  You have  a headache or neck pain.  You have unusual weakness.  You have a rash.  You have chest pain or shortness of breath.  You have abdominal pain, nausea, or vomiting.  You feel unusually tired or sleepy. MAKE SURE YOU:   Understand these instructions.  Will watch your condition.  Will get help right away if you are not doing well or get worse. Document Released: 04/10/2004 Document Revised: 05/26/2011 Document Reviewed: 10/02/2010 [facility] Patient Information 2015 [place], [place]. This information is not intended to replace advice given to you by your health care provider. Make sure you discuss any questions you have with your health care provider.

## 2014-03-26 ENCOUNTER — Encounter (HOSPITAL_COMMUNITY): Payer: Self-pay | Admitting: *Deleted

## 2014-03-26 ENCOUNTER — Emergency Department (HOSPITAL_COMMUNITY): Payer: Medicare Other

## 2014-03-26 ENCOUNTER — Emergency Department (HOSPITAL_COMMUNITY)
Admission: EM | Admit: 2014-03-26 | Discharge: 2014-03-26 | Disposition: A | Discharge status: Home / Self Care | Payer: Medicare Other | Attending: Emergency Medicine | Admitting: Emergency Medicine

## 2014-03-26 DIAGNOSIS — Z8639 Personal history of other endocrine, nutritional and metabolic disease: Secondary | ICD-10-CM | POA: Diagnosis not present

## 2014-03-26 DIAGNOSIS — M5412 Radiculopathy, cervical region: Secondary | ICD-10-CM | POA: Diagnosis not present

## 2014-03-26 DIAGNOSIS — I10 Essential (primary) hypertension: Secondary | ICD-10-CM | POA: Diagnosis not present

## 2014-03-26 DIAGNOSIS — Z79899 Other long term (current) drug therapy: Secondary | ICD-10-CM | POA: Insufficient documentation

## 2014-03-26 DIAGNOSIS — M79601 Pain in right arm: Secondary | ICD-10-CM | POA: Diagnosis present

## 2014-03-26 DIAGNOSIS — R52 Pain, unspecified: Secondary | ICD-10-CM

## 2014-03-26 LAB — CBC WITH DIFFERENTIAL/PLATELET
BASOS ABS: 0 10*3/uL (ref 0.0–0.1)
Basophils Relative: 1 % (ref 0–1)
Eosinophils Absolute: 0.1 10*3/uL (ref 0.0–0.7)
Eosinophils Relative: 1 % (ref 0–5)
HEMATOCRIT: 35.3 % — AB (ref 36.0–46.0)
HEMOGLOBIN: 11.8 g/dL — AB (ref 12.0–15.0)
Lymphocytes Relative: 30 % (ref 12–46)
Lymphs Abs: 1.7 10*3/uL (ref 0.7–4.0)
MCH: 31.2 pg (ref 26.0–34.0)
MCHC: 33.4 g/dL (ref 30.0–36.0)
MCV: 93.4 fL (ref 78.0–100.0)
MONO ABS: 0.4 10*3/uL (ref 0.1–1.0)
MONOS PCT: 8 % (ref 3–12)
Neutro Abs: 3.4 10*3/uL (ref 1.7–7.7)
Neutrophils Relative %: 60 % (ref 43–77)
PLATELETS: 232 10*3/uL (ref 150–400)
RBC: 3.78 MIL/uL — AB (ref 3.87–5.11)
RDW: 13.5 % (ref 11.5–15.5)
WBC: 5.5 10*3/uL (ref 4.0–10.5)

## 2014-03-26 LAB — BASIC METABOLIC PANEL
Anion gap: 13 (ref 5–15)
BUN: 12 mg/dL (ref 6–23)
CHLORIDE: 97 meq/L (ref 96–112)
CO2: 24 mmol/L (ref 19–32)
Calcium: 9.5 mg/dL (ref 8.4–10.5)
Creatinine, Ser: 0.89 mg/dL (ref 0.50–1.10)
GFR calc Af Amer: 68 mL/min — ABNORMAL LOW (ref >90)
GFR calc non Af Amer: 58 mL/min — ABNORMAL LOW (ref >90)
Glucose, Bld: 93 mg/dL (ref 70–99)
Potassium: 3.4 mmol/L — ABNORMAL LOW (ref 3.5–5.1)
SODIUM: 134 mmol/L — AB (ref 135–145)

## 2014-03-26 LAB — TROPONIN I: Troponin I: <0.03 ng/mL (ref <0.031)

## 2014-03-26 MED ORDER — METHYLPREDNISOLONE 4 MG PO KIT: PACK | ORAL | Status: DC

## 2014-03-26 MED ORDER — TRAMADOL HCL 50 MG PO TABS: 25.0000 mg | ORAL_TABLET | Freq: Four times a day (QID) | ORAL | Status: DC | PRN

## 2014-03-26 NOTE — ED Notes (Signed)
NAd at this time.  

## 2014-03-26 NOTE — ED Provider Notes (Signed)
CSN: [id number]     Arrival date & time 03/26/14  1221 History   First MD Initiated Contact with Patient 03/26/14 1624     Chief Complaint  Patient presents with  . Arm Pain     (Consider location/radiation/quality/duration/timing/severity/associated sxs/prior Treatment) HPI Comments: Patient presents to the ER for evaluation of pain in the right arm. Patient reports that she is experiencing "electric shocks" down her right arm intermittently for the last 3 months. She has had some numbness and tingling in the fingers of the right hand as well. She has not identified any factors that cause the electric shocks to occur. She has not noticed any weakness in the extremity or change in sensation. There is no chest pain or shortness of breath.  Patient is a 81 y.o. female presenting with arm pain.  Arm Pain    Past Medical History  Diagnosis Date  . Hypertension   . Hypercalcemia   . Headache   . Hypokalemia    History reviewed. No pertinent past surgical history. No family history on file. History  Substance Use Topics  . Smoking status: Never Smoker   . Smokeless tobacco: Current User    Types: Snuff  . Alcohol Use: No   OB History    No data available     Review of Systems  Neurological: Negative for weakness and numbness.       "tingling"      Allergies  Amlodipine besylate; Benazepril hcl; Propranolol hcl; and Trandolapril  Home Medications   Prior to Admission medications   Medication Sig Start Date End Date Taking? Authorizing Provider  acetaminophen (TYLENOL) 325 MG tablet Take 650 mg by mouth every 6 (six) hours as needed for mild pain.    Historical Provider, MD  amLODipine (NORVASC) 10 MG tablet Take 5 mg by mouth every morning.     Historical Provider, MD  butalbital-acetaminophen-caffeine (FIORICET, ESGIC) 50-325-40 MG per tablet Take 1 tablet by mouth 2 (two) times daily as needed for headache.    Historical Provider, MD  calcium-vitamin D (OSCAL 500/200  D-3) 500-200 MG-UNIT per tablet Take 1 tablet by mouth 2 (two) times daily.    Historical Provider, MD  diazepam (VALIUM) 5 MG tablet Take 2.5 mg by mouth 2 (two) times daily as needed for anxiety (anxiety).     Historical Provider, MD  furosemide (LASIX) 20 MG tablet Take 20 mg by mouth daily as needed for fluid.     Historical Provider, MD  hydrOXYzine (ATARAX/VISTARIL) 25 MG tablet Take 1 tablet (25 mg total) by mouth every 6 (six) hours. 09/23/13   Veryl Speak, MD  lisinopril (PRINIVIL,ZESTRIL) 20 MG tablet Take 40 mg by mouth every morning.     Historical Provider, MD  loratadine (CLARITIN) 10 MG tablet Take 10 mg by mouth daily as needed for allergies.    Historical Provider, MD  metoprolol (LOPRESSOR) 50 MG tablet Take 50 mg by mouth 2 (two) times daily.    Historical Provider, MD  mometasone (NASONEX) 50 MCG/ACT nasal spray Place 2 sprays into the nose daily as needed (allergies).    Historical Provider, MD  omeprazole (PRILOSEC) 20 MG capsule Take 20 mg by mouth daily.    Historical Provider, MD  potassium chloride (MICRO-K) 10 MEQ CR capsule Take 10 mEq by mouth daily. When taking Furosemide    Historical Provider, MD  pravastatin (PRAVACHOL) 40 MG tablet Take 40 mg by mouth daily.    Historical Provider, MD   BP 154/77 mmHg  Pulse 86  Temp(Src) 97.8 F (36.6 C) (Oral)  Resp 18  Ht 5\' 2"  (1.575 m)  Wt 130 lb (58.968 kg)  BMI 23.77 kg/m2  SpO2 100% Physical Exam  Constitutional: She is oriented to person, place, and time. She appears well-developed and well-nourished. No distress.  HENT:  Head: Normocephalic and atraumatic.  Right Ear: Hearing normal.  Left Ear: Hearing normal.  Nose: Nose normal.  Mouth/Throat: Oropharynx is clear and moist and mucous membranes are normal.  Eyes: Conjunctivae and EOM are normal. Pupils are equal, round, and reactive to light.  Neck: Normal range of motion. Neck supple.  Cardiovascular: Regular rhythm, S1 normal and S2 normal.  Exam reveals no  gallop and no friction rub.   No murmur heard. Pulmonary/Chest: Effort normal and breath sounds normal. No respiratory distress. She exhibits no tenderness.  Abdominal: Soft. Normal appearance and bowel sounds are normal. There is no hepatosplenomegaly. There is no tenderness. There is no rebound, no guarding, no tenderness at McBurney's point and negative Murphy's sign. No hernia.  Musculoskeletal: Normal range of motion.  Neurological: She is alert and oriented to person, place, and time. She has normal strength. No cranial nerve deficit or sensory deficit. Coordination normal. GCS eye subscore is 4. GCS verbal subscore is 5. GCS motor subscore is 6.  Normal strength bilateral upper extremity Normal sensation bilateral upper extremity   Skin: Skin is warm, dry and intact. No rash noted. No cyanosis.  Psychiatric: She has a normal mood and affect. Her speech is normal and behavior is normal. Thought content normal.  Nursing note and vitals reviewed.   ED Course  Procedures (including critical care time) Labs Review Labs Reviewed  CBC WITH DIFFERENTIAL  BASIC METABOLIC PANEL  TROPONIN I    Imaging Review No results found.   EKG Interpretation   Date/Time:  Sunday March 26 2014 12:34:12 EST Ventricular Rate:  118 PR Interval:  96 QRS Duration: 74 QT Interval:  430 QTC Calculation: 602 R Axis:   55 Text Interpretation:  Sinus tachycardia with short PR with frequent  Premature ventricular complexes Nonspecific ST and T wave abnormality  Prolonged QT Abnormal ECG No significant change since last tracing  Confirmed by Juleah Paradise  MD, Delecia Vastine [phone]) on 03/26/2014 4:42:46 PM      MDM   Final diagnoses:  Pain  Cervical radiculopathy    Patient presents to the ER for evaluation of pain in the right arm that has been ongoing for months. Symptoms are intermittent and described as "electric shock". She does not have any neurologic deficit on examination. She has normal  strength, sensation in the upper extremities. Pain is limited to the arm and appears to be radicular in nature. I do not suspect stroke or TIA, as the symptoms are on echo for months, come and go quickly. Cervical spine x-rays show severe degenerative changes which suggest radiculopathy. X-ray of shoulder does not show any significant pathology. Lab work was unremarkable. No concern for cardiac abnormality, EKG unchanged from previous. Troponin is negative. Patient will be discharge, analgesia and follow-up with primary doctor for further evaluation.    Orpah Greek, MD 03/26/14 [phone]

## 2014-03-26 NOTE — ED Notes (Signed)
Pt states that she has had rt arm pain and tingling in her fingers. Pt denies and chest pain or SOB. Pt states that she has normal sensation in arm and hand.

## 2014-03-26 NOTE — Discharge Instructions (Signed)

## 2014-05-24 ENCOUNTER — Emergency Department (HOSPITAL_COMMUNITY)
Admission: EM | Admit: 2014-05-24 | Discharge: 2014-05-24 | Disposition: A | Discharge status: Home / Self Care | Payer: Medicare Other | Attending: Emergency Medicine | Admitting: Emergency Medicine

## 2014-05-24 ENCOUNTER — Encounter (HOSPITAL_COMMUNITY): Payer: Self-pay | Admitting: Emergency Medicine

## 2014-05-24 DIAGNOSIS — I10 Essential (primary) hypertension: Secondary | ICD-10-CM | POA: Insufficient documentation

## 2014-05-24 DIAGNOSIS — M19011 Primary osteoarthritis, right shoulder: Secondary | ICD-10-CM | POA: Diagnosis not present

## 2014-05-24 DIAGNOSIS — M5030 Other cervical disc degeneration, unspecified cervical region: Secondary | ICD-10-CM | POA: Diagnosis not present

## 2014-05-24 DIAGNOSIS — Z79899 Other long term (current) drug therapy: Secondary | ICD-10-CM | POA: Diagnosis not present

## 2014-05-24 DIAGNOSIS — R200 Anesthesia of skin: Secondary | ICD-10-CM | POA: Diagnosis present

## 2014-05-24 DIAGNOSIS — E876 Hypokalemia: Secondary | ICD-10-CM | POA: Diagnosis not present

## 2014-05-24 DIAGNOSIS — F446 Conversion disorder with sensory symptom or deficit: Secondary | ICD-10-CM | POA: Insufficient documentation

## 2014-05-24 DIAGNOSIS — M5412 Radiculopathy, cervical region: Secondary | ICD-10-CM | POA: Diagnosis not present

## 2014-05-24 LAB — COMPREHENSIVE METABOLIC PANEL
ALT: 13 U/L (ref 0–35)
ANION GAP: 6 (ref 5–15)
AST: 18 U/L (ref 0–37)
Albumin: 4.1 g/dL (ref 3.5–5.2)
Alkaline Phosphatase: 50 U/L (ref 39–117)
BILIRUBIN TOTAL: 0.4 mg/dL (ref 0.3–1.2)
BUN: 14 mg/dL (ref 6–23)
CALCIUM: 9.3 mg/dL (ref 8.4–10.5)
CO2: 29 mmol/L (ref 19–32)
CREATININE: 1.09 mg/dL (ref 0.50–1.10)
Chloride: 100 mmol/L (ref 96–112)
GFR calc Af Amer: 52 mL/min — ABNORMAL LOW (ref >90)
GFR calc non Af Amer: 45 mL/min — ABNORMAL LOW (ref >90)
GLUCOSE: 121 mg/dL — AB (ref 70–99)
POTASSIUM: 3.2 mmol/L — AB (ref 3.5–5.1)
Sodium: 135 mmol/L (ref 135–145)
Total Protein: 6.4 g/dL (ref 6.0–8.3)

## 2014-05-24 LAB — CBC WITH DIFFERENTIAL/PLATELET
BASOS ABS: 0 10*3/uL (ref 0.0–0.1)
BASOS PCT: 0 % (ref 0–1)
Eosinophils Absolute: 0.1 10*3/uL (ref 0.0–0.7)
Eosinophils Relative: 1 % (ref 0–5)
HEMATOCRIT: 33.3 % — AB (ref 36.0–46.0)
Hemoglobin: 11.1 g/dL — ABNORMAL LOW (ref 12.0–15.0)
LYMPHS ABS: 1.2 10*3/uL (ref 0.7–4.0)
Lymphocytes Relative: 25 % (ref 12–46)
MCH: 30.9 pg (ref 26.0–34.0)
MCHC: 33.3 g/dL (ref 30.0–36.0)
MCV: 92.8 fL (ref 78.0–100.0)
MONO ABS: 0.5 10*3/uL (ref 0.1–1.0)
Monocytes Relative: 10 % (ref 3–12)
NEUTROS ABS: 3.2 10*3/uL (ref 1.7–7.7)
Neutrophils Relative %: 64 % (ref 43–77)
PLATELETS: 232 10*3/uL (ref 150–400)
RBC: 3.59 MIL/uL — ABNORMAL LOW (ref 3.87–5.11)
RDW: 14.3 % (ref 11.5–15.5)
WBC: 5 10*3/uL (ref 4.0–10.5)

## 2014-05-24 LAB — I-STAT TROPONIN, ED: Troponin i, poc: 0 ng/mL (ref 0.00–0.08)

## 2014-05-24 MED ORDER — METHYLPREDNISOLONE 4 MG PO KIT: PACK | ORAL | Status: DC

## 2014-05-24 MED ORDER — TRAMADOL HCL 50 MG PO TABS: 50.0000 mg | ORAL_TABLET | Freq: Four times a day (QID) | ORAL | Status: DC | PRN

## 2014-05-24 NOTE — ED Provider Notes (Signed)
CSN: [id number]     Arrival date & time 05/24/14  1443 History   First MD Initiated Contact with Patient 05/24/14 1657     Chief Complaint  Patient presents with  . Numbness     (Consider location/radiation/quality/duration/timing/severity/associated sxs/prior Treatment) HPI Pt is an 81yo female with hx of HTN, hypercalcemia, HA and hypokalemia, presenting to ED for further evaluation of intermittent right arm "tingling" and right thumb "numbness"  Pt was seen on 03/26/14 for same, evaluated for similar symptoms that had been intermittent for 3 months at that time.  Today, symptoms have been intermittent for about 5 months. Denies new injuries or change in symptoms. Denies pain to right arm. States she was prescribed a prednisone dose pack as well as tramadol which did help with symptoms initially but she has run out of the medication.  States she had not f/u with PCP yet as she did not have a ride to her appointment in January 2016, and today, she was unable to be seen as she did not have an appointment and they were "too busy." pt denies trauma to right arm, neck pain or headache. Denies hx of diabetes. Denies chest pain or SOB.   Per medical records, cervical spine and right shoulder plain films performed on 03/26/14 indicated pt has degenerative disc disease in cervical spine and degenerative changes in her right shoulder. Pt was diagnosed with cervical radiculopathy.  She has not f/u with orthopedist or neurosurgery for symptoms.  Denies fever, chills, n/v/d. No change in skin color, rash or wounds.    Past Medical History  Diagnosis Date  . Hypertension   . Hypercalcemia   . Headache   . Hypokalemia    History reviewed. No pertinent past surgical history. History reviewed. No pertinent family history. History  Substance Use Topics  . Smoking status: Never Smoker   . Smokeless tobacco: Current User    Types: Snuff  . Alcohol Use: No   OB History    No data available     Review of  Systems  Constitutional: Negative for fever, chills, diaphoresis and fatigue.  Respiratory: Negative for cough and shortness of breath.   Cardiovascular: Negative for chest pain, palpitations and leg swelling.  Gastrointestinal: Negative for nausea, vomiting, abdominal pain and diarrhea.  Musculoskeletal: Negative for myalgias and neck pain.  Skin: Negative for color change, rash and wound.  Neurological: Positive for numbness. Negative for dizziness, tremors, weakness, light-headedness and headaches.  All other systems reviewed and are negative.     Allergies  Propranolol hcl; Benazepril hcl; Amlodipine besylate; and Trandolapril  Home Medications   Prior to Admission medications   Medication Sig Start Date End Date Taking? Authorizing Provider  amLODipine (NORVASC) 10 MG tablet Take 5 mg by mouth every morning.    Yes Historical Provider, MD  Butalbital-APAP-Caffeine 50-300-40 MG CAPS Take 1 tablet by mouth as needed. For migraines 05/19/14  Yes Historical Provider, MD  cyclobenzaprine (FLEXERIL) 5 MG tablet Take 5 mg by mouth 2 (two) times daily as needed for muscle spasms.  05/19/14  Yes Historical Provider, MD  dorzolamide (TRUSOPT) 2 % ophthalmic solution Place 1 drop into both eyes 2 (two) times daily. 05/19/14  Yes Historical Provider, MD  furosemide (LASIX) 20 MG tablet Take 20 mg by mouth daily.    Yes Historical Provider, MD  hydrOXYzine (ATARAX/VISTARIL) 25 MG tablet Take 1 tablet (25 mg total) by mouth every 6 (six) hours. Patient taking differently: Take 25 mg by mouth every 6 (  six) hours as needed for itching or nausea.  09/23/13  Yes Veryl Speak, MD  latanoprost (XALATAN) 0.005 % ophthalmic solution Place 1 drop into both eyes at bedtime. 05/19/14  Yes Historical Provider, MD  lisinopril (PRINIVIL,ZESTRIL) 40 MG tablet Take 40 mg by mouth daily. 05/05/14  Yes Historical Provider, MD  loratadine (CLARITIN) 10 MG tablet Take 10 mg by mouth daily as needed for allergies.   Yes  Historical Provider, MD  metoprolol (LOPRESSOR) 50 MG tablet Take 50 mg by mouth 2 (two) times daily.   Yes Historical Provider, MD  potassium chloride (MICRO-K) 10 MEQ CR capsule Take 10 mEq by mouth daily. When taking Furosemide   Yes Historical Provider, MD  pravastatin (PRAVACHOL) 40 MG tablet Take 40 mg by mouth every evening.    Yes Historical Provider, MD  traMADol (ULTRAM) 50 MG tablet Take 0.5 tablets (25 mg total) by mouth every 6 (six) hours as needed. Patient taking differently: Take 25 mg by mouth every 6 (six) hours as needed for moderate pain.  03/26/14  Yes Orpah Greek, MD  calcium-vitamin D (OSCAL 500/200 D-3) 500-200 MG-UNIT per tablet Take 1 tablet by mouth 2 (two) times daily.    Historical Provider, MD  diazepam (VALIUM) 5 MG tablet Take 2.5 mg by mouth 2 (two) times daily as needed for anxiety (anxiety).     Historical Provider, MD  methylPREDNISolone (MEDROL DOSEPAK) 4 MG tablet As directed 05/24/14   Noland Fordyce, PA-C  traMADol (ULTRAM) 50 MG tablet Take 1 tablet (50 mg total) by mouth every 6 (six) hours as needed. 05/24/14   Noland Fordyce, PA-C   BP 146/63 mmHg  Pulse 69  Temp(Src) 98.1 F (36.7 C) (Oral)  Resp 18  SpO2 100% Physical Exam  Constitutional: She is oriented to person, place, and time. She appears well-developed and well-nourished. No distress.  HENT:  Head: Normocephalic and atraumatic.  Eyes: Conjunctivae are normal. No scleral icterus.  Neck: Normal range of motion. Neck supple.  No midline bone tenderness, no crepitus or step-offs.   Cardiovascular: Normal rate, regular rhythm and normal heart sounds.   Pulmonary/Chest: Effort normal and breath sounds normal. No respiratory distress. She has no wheezes. She has no rales. She exhibits no tenderness.  Abdominal: Soft. Bowel sounds are normal. She exhibits no distension and no mass. There is no tenderness. There is no rebound and no guarding.  Musculoskeletal: Normal range of motion. She  exhibits tenderness. She exhibits no edema.  Right shoulder: tenderness over AC joint. No midline spinal tenderness.   Lymphadenopathy:    She has no cervical adenopathy.  Neurological: She is alert and oriented to person, place, and time. She has normal strength. A sensory deficit is present. No cranial nerve deficit. She displays a negative Romberg sign. Coordination and gait normal. GCS eye subscore is 4. GCS verbal subscore is 5. GCS motor subscore is 6.  CN II-XII in tact. Speech if fluent, FROM upper and lower extremities with 5/5 strength bilaterally. Decreased sensation to sharp touch at tip of Right though, otherwise, sensation in tact.   Skin: Skin is warm and dry. She is not diaphoretic.  Nursing note and vitals reviewed.   ED Course  Procedures (including critical care time) Labs Review Labs Reviewed  CBC WITH DIFFERENTIAL/PLATELET - Abnormal; Notable for the following:    RBC 3.59 (*)    Hemoglobin 11.1 (*)    HCT 33.3 (*)    All other components within normal limits  COMPREHENSIVE METABOLIC  PANEL - Abnormal; Notable for the following:    Potassium 3.2 (*)    Glucose, Bld 121 (*)    GFR calc non Af Amer 45 (*)    GFR calc Af Amer 52 (*)    All other components within normal limits  I-STAT TROPOININ, ED    Imaging Review No results found.   EKG Interpretation None      MDM   Final diagnoses:  Right cervical radiculopathy  Osteoarthritis of right shoulder, unspecified osteoarthritis type  Degenerative disc disease, cervical    Pt is an 81yo female c/o a 5-6 month long hx of intermittent tingling in right arm with numbness to right thumb.  Pt seen on 03/26/14 for same, dx with cervical radiculopathy as she was found to have cervical DDD as well as OA in her right shoulder. Pt denies changes in symptoms but does report the medrol dose pack and tramadol helped with pain temporarily.  She has not f/u with PCP or orthopedist yet. States she attempted to PCP today but  they were too busy and she did not have an appointment. On exam, pt does have decreased sensation to touch in right thumb, otherwise no focal neuro deficit. Pt does have tenderness to right AC joint but no cervical spinal tenderness.    Labs: unremarkable.  Vitals: WNL.  Discussed pt with Dr. Colin Rhein who also examined pt.  Agrees with assessment, pt stable for discharge home. No additional imaging indicated at this time. Rx: tramadol and prednisone taper.  Home care instructions provided. Advised to f/u with her PCP Dr. Ursula Beath for recheck of symptoms and possible referral to orthopedist for further treatment of symptoms. Return precautions provided. Pt and husband verbalized understanding and agreement with tx plan.     Noland Fordyce, PA-C 05/25/14 0003  Debby Freiberg, MD 05/29/14 [phone]

## 2014-05-24 NOTE — ED Notes (Signed)
Pt sts numbness and tingling to right arm x 3 months with pain at times; pt noted to use extremity and no weakness noted

## 2014-05-24 NOTE — Discharge Instructions (Signed)
Cervical Radiculopathy Cervical radiculopathy means a nerve in the neck is pinched or bruised. This can cause pain or loss of feeling (numbness) that runs from your neck to your arm and fingers. HOME CARE   Put ice on the injured or painful area.  Put ice in a plastic bag.  Place a towel between your skin and the bag.  Leave the ice on for 15-20 minutes, 03-04 times a day, or as told by your doctor.  If ice does not help, you can try using heat. Take a warm shower or bath, or use a hot water bottle as told by your doctor.  You may try a gentle neck and shoulder massage.  Use a flat pillow when you sleep.  Only take medicines as told by your doctor.  Keep all physical therapy visits as told by your doctor.  If you are given a soft collar, wear it as told by your doctor. GET HELP RIGHT AWAY IF:   Your pain gets worse and is not controlled with medicine.  You lose feeling or feel weak in your hand, arm, face, or leg.  You have a fever or stiff neck.  You cannot control when you poop or pee (incontinence).  You have trouble with walking, balance, or speaking. MAKE SURE YOU:   Understand these instructions.  Will watch your condition.  Will get help right away if you are not doing well or get worse. Document Released: 02/20/2011 Document Revised: 05/26/2011 Document Reviewed: 02/20/2011 [facility] Patient Information 2015 [place], [place]. This information is not intended to replace advice given to you by your health care provider. Make sure you discuss any questions you have with your health care provider.  Arthritis, Nonspecific Arthritis is pain, redness, warmth, or puffiness (inflammation) of a joint. The joint may be stiff or hurt when you move it. One or more joints may be affected. There are many types of arthritis. Your doctor may not know what type you have right away. The most common cause of arthritis is wear and tear on the joint (osteoarthritis). HOME CARE   Only  take medicine as told by your doctor.  Rest the joint as much as possible.  Raise (elevate) your joint if it is puffy.  Use crutches if the painful joint is in your leg.  Drink enough fluids to keep your pee (urine) clear or pale yellow.  Follow your doctor's diet instructions.  Use cold packs for very bad joint pain for 10 to 15 minutes every hour. Ask your doctor if it is okay for you to use hot packs.  Exercise as told by your doctor.  Take a warm shower if you have stiffness in the morning.  Move your sore joints throughout the day. GET HELP RIGHT AWAY IF:   You have a fever.  You have very bad joint pain, puffiness, or redness.  You have many joints that are painful and puffy.  You are not getting better with treatment.  You have very bad back pain or leg weakness.  You cannot control when you poop (bowel movement) or pee (urinate).  You do not feel better in 24 hours or are getting worse.  You are having side effects from your medicine. MAKE SURE YOU:   Understand these instructions.  Will watch your condition.  Will get help right away if you are not doing well or get worse. Document Released: 05/28/2009 Document Revised: 09/02/2011 Document Reviewed: 05/28/2009 [facility] Patient Information 2015 [place], [place]. This information is not  intended to replace advice given to you by your health care provider. Make sure you discuss any questions you have with your health care provider. ° °

## 2014-06-23 ENCOUNTER — Encounter (HOSPITAL_COMMUNITY): Payer: Self-pay | Admitting: *Deleted

## 2014-06-23 ENCOUNTER — Emergency Department (INDEPENDENT_AMBULATORY_CARE_PROVIDER_SITE_OTHER)
Admission: EM | Admit: 2014-06-23 | Discharge: 2014-06-23 | Disposition: A | Discharge status: Home / Self Care | Payer: Medicare Other | Source: Home / Self Care | Attending: Family Medicine | Admitting: Family Medicine

## 2014-06-23 DIAGNOSIS — IMO0001 Reserved for inherently not codable concepts without codable children: Secondary | ICD-10-CM

## 2014-06-23 DIAGNOSIS — R030 Elevated blood-pressure reading, without diagnosis of hypertension: Secondary | ICD-10-CM

## 2014-06-23 LAB — POCT I-STAT, CHEM 8
BUN: 8 mg/dL (ref 6–23)
CHLORIDE: 99 mmol/L (ref 96–112)
Calcium, Ion: 1.25 mmol/L (ref 1.13–1.30)
Creatinine, Ser: 1 mg/dL (ref 0.50–1.10)
GLUCOSE: 105 mg/dL — AB (ref 70–99)
HCT: 41 % (ref 36.0–46.0)
Hemoglobin: 13.9 g/dL (ref 12.0–15.0)
Potassium: 3.9 mmol/L (ref 3.5–5.1)
Sodium: 137 mmol/L (ref 135–145)
TCO2: 25 mmol/L (ref 0–100)

## 2014-06-23 LAB — POCT URINALYSIS DIP (DEVICE)
Bilirubin Urine: NEGATIVE
Glucose, UA: NEGATIVE mg/dL
Ketones, ur: NEGATIVE mg/dL
Leukocytes, UA: NEGATIVE
Nitrite: NEGATIVE
Protein, ur: NEGATIVE mg/dL
SPECIFIC GRAVITY, URINE: 1.01 (ref 1.005–1.030)
Urobilinogen, UA: 0.2 mg/dL (ref 0.0–1.0)
pH: 7 (ref 5.0–8.0)

## 2014-06-23 NOTE — Discharge Instructions (Signed)
Your blood pressure readings today were 174/84 & 177/77. While these area somewhat elevated, they do not indicate the need for treatment at the hospital. I would advise you to contact your primary care doctor as soon as possible for re-evaluation and possible medication adjustment. Your blood work and urine studies today were normal. If you develop dizziness, chest pain or shortness of breath, please seek re-evaluation at your nearest emergency room.  Hypertension Hypertension, commonly called high blood pressure, is when the force of blood pumping through your arteries is too strong. Your arteries are the blood vessels that carry blood from your heart throughout your body. A blood pressure reading consists of a higher number over a lower number, such as 110/72. The higher number (systolic) is the pressure inside your arteries when your heart pumps. The lower number (diastolic) is the pressure inside your arteries when your heart relaxes. Ideally you want your blood pressure below 120/80. Hypertension forces your heart to work harder to pump blood. Your arteries may become narrow or stiff. Having hypertension puts you at risk for heart disease, stroke, and other problems.  RISK FACTORS Some risk factors for high blood pressure are controllable. Others are not.  Risk factors you cannot control include:   Race. You may be at higher risk if you are African American.  Age. Risk increases with age.  Gender. Men are at higher risk than women before age 45 years. After age 76, women are at higher risk than men. Risk factors you can control include:  Not getting enough exercise or physical activity.  Being overweight.  Getting too much fat, sugar, calories, or salt in your diet.  Drinking too much alcohol. SIGNS AND SYMPTOMS Hypertension does not usually cause signs or symptoms. Extremely high blood pressure (hypertensive crisis) may cause headache, anxiety, shortness of breath, and  nosebleed. DIAGNOSIS  To check if you have hypertension, your health care provider will measure your blood pressure while you are seated, with your arm held at the level of your heart. It should be measured at least twice using the same arm. Certain conditions can cause a difference in blood pressure between your right and left arms. A blood pressure reading that is higher than normal on one occasion does not mean that you need treatment. If one blood pressure reading is high, ask your health care provider about having it checked again. TREATMENT  Treating high blood pressure includes making lifestyle changes and possibly taking medicine. Living a healthy lifestyle can help lower high blood pressure. You may need to change some of your habits. Lifestyle changes may include:  Following the DASH diet. This diet is high in fruits, vegetables, and whole grains. It is low in salt, red meat, and added sugars.  Getting at least 2 hours of brisk physical activity every week.  Losing weight if necessary.  Not smoking.  Limiting alcoholic beverages.  Learning ways to reduce stress. If lifestyle changes are not enough to get your blood pressure under control, your health care provider may prescribe medicine. You may need to take more than one. Work closely with your health care provider to understand the risks and benefits. HOME CARE INSTRUCTIONS  Have your blood pressure rechecked as directed by your health care provider.   Take medicines only as directed by your health care provider. Follow the directions carefully. Blood pressure medicines must be taken as prescribed. The medicine does not work as well when you skip doses. Skipping doses also puts you at risk  for problems.   Do not smoke.   Monitor your blood pressure at home as directed by your health care provider. SEEK MEDICAL CARE IF:   You think you are having a reaction to medicines taken.  You have recurrent headaches or feel  dizzy.  You have swelling in your ankles.  You have trouble with your vision. SEEK IMMEDIATE MEDICAL CARE IF:  You develop a severe headache or confusion.  You have unusual weakness, numbness, or feel faint.  You have severe chest or abdominal pain.  You vomit repeatedly.  You have trouble breathing. MAKE SURE YOU:   Understand these instructions.  Will watch your condition.  Will get help right away if you are not doing well or get worse. Document Released: 03/03/2005 Document Revised: 07/18/2013 Document Reviewed: 12/24/2012 [facility] Patient Information 2015 [place], [place]. This information is not intended to replace advice given to you by your health care provider. Make sure you discuss any questions you have with your health care provider.  How to Take Your Blood Pressure HOW DO I GET A BLOOD PRESSURE MACHINE?  You can buy an electronic home blood pressure machine at your local pharmacy. Insurance will sometimes cover the cost if you have a prescription.  Ask your doctor what type of machine is best for you. There are different machines for your arm and your wrist.  If you decide to buy a machine to check your blood pressure on your arm, first check the size of your arm so you can buy the right size cuff. To check the size of your arm:   Use a measuring tape that shows both inches and centimeters.   Wrap the measuring tape around the upper-middle part of your arm. You may need someone to help you measure.   Write down your arm measurement in both inches and centimeters.   To measure your blood pressure correctly, it is important to have the right size cuff.   If your arm is up to 13 inches (up to 34 centimeters), get an adult cuff size.  If your arm is 13 to 17 inches (35 to 44 centimeters), get a large adult cuff size.    If your arm is 17 to 20 inches (45 to 52 centimeters), get an adult thigh cuff.  WHAT DO THE NUMBERS MEAN?   There are two numbers  that make up your blood pressure. For example: 120/80.  The first number (120 in our example) is called the "systolic pressure." It is a measure of the pressure in your blood vessels when your heart is pumping blood.  The second number (80 in our example) is called the "diastolic pressure." It is a measure of the pressure in your blood vessels when your heart is resting between beats.  Your doctor will tell you what your blood pressure should be. WHAT SHOULD I DO BEFORE I CHECK MY BLOOD PRESSURE?   Try to rest or relax for at least 30 minutes before you check your blood pressure.  Do not smoke.  Do not have any drinks with caffeine, such as:  Soda.  Coffee.  Tea.  Check your blood pressure in a quiet room.  Sit down and stretch out your arm on a table. Keep your arm at about the level of your heart. Let your arm relax.  Make sure that your legs are not crossed. HOW DO I CHECK MY BLOOD PRESSURE?  Follow the directions that came with your machine.  Make sure you remove any  tight-fighting clothing from your arm or wrist. Wrap the cuff around your upper arm or wrist. You should be able to fit a finger between the cuff and your arm. If you cannot fit a finger between the cuff and your arm, it is too tight and should be removed and rewrapped.  Some units require you to manually pump up the arm cuff.  Automatic units inflate the cuff when you press a button.  Cuff deflation is automatic in both models.  After the cuff is inflated, the unit measures your blood pressure and pulse. The readings are shown on a monitor. Hold still and breathe normally while the cuff is inflated.  Getting a reading takes less than a minute.  Some models store readings in a memory. Some provide a printout of readings. If your machine does not store your readings, keep a written record.  Take readings with you to your next visit with your doctor. Document Released: 02/14/2008 Document Revised:  07/18/2013 Document Reviewed: 04/28/2013 [facility] Patient Information 2015 [place], [place]. This information is not intended to replace advice given to you by your health care provider. Make sure you discuss any questions you have with your health care provider.

## 2014-06-23 NOTE — ED Notes (Signed)
Pt is here with complaints of elevated BP

## 2014-06-23 NOTE — ED Provider Notes (Signed)
CSN: [id number]     Arrival date & time 06/23/14  1102 History   First MD Initiated Contact with Patient 06/23/14 1215     Chief Complaint  Patient presents with  . Hypertension   (Consider location/radiation/quality/duration/timing/severity/associated sxs/prior Treatment) HPI Comments: Patient states she became concerned and nervous at home this morning when she checked her BP at home about 30 minutes after taking her blood pressure medication and found her blood pressure to be elevated. Attempted to contact her PCP but states the office was closed today. Therefore, she comes to the [facility] for evaluation.  At time of visit, she reports herself to be asymptomatic. Also expresses concern that her potassium supplement was recently adjusted (increased) and she is concerned blood pressure elevation is related to change in potassium dose.   Patient is a 81 y.o. female presenting with hypertension. The history is provided by the patient.  Hypertension This is a chronic problem.    Past Medical History  Diagnosis Date  . Hypertension   . Hypercalcemia   . Headache   . Hypokalemia    History reviewed. No pertinent past surgical history. History reviewed. No pertinent family history. History  Substance Use Topics  . Smoking status: Never Smoker   . Smokeless tobacco: Current User    Types: Snuff  . Alcohol Use: No   OB History    No data available     Review of Systems  All other systems reviewed and are negative.   Allergies  Propranolol hcl; Benazepril hcl; Amlodipine besylate; and Trandolapril  Home Medications   Prior to Admission medications   Medication Sig Start Date End Date Taking? Authorizing Provider  amLODipine (NORVASC) 10 MG tablet Take 5 mg by mouth every morning.     Historical Provider, MD  Butalbital-APAP-Caffeine 50-300-40 MG CAPS Take 1 tablet by mouth as needed. For migraines 05/19/14   Historical Provider, MD  calcium-vitamin D (OSCAL 500/200 D-3) 500-200  MG-UNIT per tablet Take 1 tablet by mouth 2 (two) times daily.    Historical Provider, MD  cyclobenzaprine (FLEXERIL) 5 MG tablet Take 5 mg by mouth 2 (two) times daily as needed for muscle spasms.  05/19/14   Historical Provider, MD  diazepam (VALIUM) 5 MG tablet Take 2.5 mg by mouth 2 (two) times daily as needed for anxiety (anxiety).     Historical Provider, MD  dorzolamide (TRUSOPT) 2 % ophthalmic solution Place 1 drop into both eyes 2 (two) times daily. 05/19/14   Historical Provider, MD  furosemide (LASIX) 20 MG tablet Take 20 mg by mouth daily.     Historical Provider, MD  hydrOXYzine (ATARAX/VISTARIL) 25 MG tablet Take 1 tablet (25 mg total) by mouth every 6 (six) hours. Patient taking differently: Take 25 mg by mouth every 6 (six) hours as needed for itching or nausea.  09/23/13   Veryl Speak, MD  latanoprost (XALATAN) 0.005 % ophthalmic solution Place 1 drop into both eyes at bedtime. 05/19/14   Historical Provider, MD  lisinopril (PRINIVIL,ZESTRIL) 40 MG tablet Take 40 mg by mouth daily. 05/05/14   Historical Provider, MD  loratadine (CLARITIN) 10 MG tablet Take 10 mg by mouth daily as needed for allergies.    Historical Provider, MD  methylPREDNISolone (MEDROL DOSEPAK) 4 MG tablet As directed 05/24/14   Noland Fordyce, PA-C  metoprolol (LOPRESSOR) 50 MG tablet Take 50 mg by mouth 2 (two) times daily.    Historical Provider, MD  potassium chloride (MICRO-K) 10 MEQ CR capsule Take 10 mEq by  mouth daily. When taking Furosemide    Historical Provider, MD  pravastatin (PRAVACHOL) 40 MG tablet Take 40 mg by mouth every evening.     Historical Provider, MD  traMADol (ULTRAM) 50 MG tablet Take 0.5 tablets (25 mg total) by mouth every 6 (six) hours as needed. Patient taking differently: Take 25 mg by mouth every 6 (six) hours as needed for moderate pain.  03/26/14   Orpah Greek, MD  traMADol (ULTRAM) 50 MG tablet Take 1 tablet (50 mg total) by mouth every 6 (six) hours as needed. 05/24/14   Noland Fordyce, PA-C   BP 174/84 mmHg  Pulse 60  Temp(Src) 98 F (36.7 C) (Oral)  Resp 18  SpO2 100% Physical Exam  Constitutional: She is oriented to person, place, and time. She appears well-developed and well-nourished. No distress.  HENT:  Head: Normocephalic and atraumatic.  Eyes: Conjunctivae are normal. No scleral icterus.  Cardiovascular: Normal rate, regular rhythm and normal heart sounds.   Pulmonary/Chest: Effort normal and breath sounds normal. No respiratory distress. She has no wheezes.  Musculoskeletal: Normal range of motion.  Neurological: She is alert and oriented to person, place, and time.  Skin: Skin is warm and dry.  Psychiatric: She has a normal mood and affect. Her behavior is normal.  Nursing note and vitals reviewed.   ED Course  Procedures (including critical care time) Labs Review Labs Reviewed  POCT I-STAT, CHEM 8 - Abnormal; Notable for the following:    Glucose, Bld 105 (*)    All other components within normal limits  POCT URINALYSIS DIP (DEVICE) - Abnormal; Notable for the following:    Hgb urine dipstick TRACE (*)    All other components within normal limits    Imaging Review No results found.   MDM   1. Elevated blood pressure   Your blood pressure readings today were 174/84 & 177/77. While these area somewhat elevated, they do not indicate the need for treatment at the hospital. I would advise you to contact your primary care doctor as soon as possible for re-evaluation and possible medication adjustment. Your blood work and urine studies today were normal. If you develop dizziness, chest pain or shortness of breath, please seek re-evaluation at your nearest emergency room.     Lutricia Feil, [place] 06/23/14 1352

## 2014-10-05 ENCOUNTER — Emergency Department (HOSPITAL_COMMUNITY): Payer: Medicare Other

## 2014-10-05 ENCOUNTER — Emergency Department (HOSPITAL_COMMUNITY)
Admission: EM | Admit: 2014-10-05 | Discharge: 2014-10-05 | Disposition: A | Discharge status: Home / Self Care | Payer: Medicare Other | Attending: Emergency Medicine | Admitting: Emergency Medicine

## 2014-10-05 ENCOUNTER — Encounter (HOSPITAL_COMMUNITY): Payer: Self-pay

## 2014-10-05 DIAGNOSIS — I10 Essential (primary) hypertension: Secondary | ICD-10-CM | POA: Insufficient documentation

## 2014-10-05 DIAGNOSIS — R600 Localized edema: Secondary | ICD-10-CM | POA: Diagnosis not present

## 2014-10-05 DIAGNOSIS — M7989 Other specified soft tissue disorders: Secondary | ICD-10-CM | POA: Diagnosis present

## 2014-10-05 DIAGNOSIS — Z79899 Other long term (current) drug therapy: Secondary | ICD-10-CM | POA: Insufficient documentation

## 2014-10-05 DIAGNOSIS — R609 Edema, unspecified: Secondary | ICD-10-CM

## 2014-10-05 LAB — CBC WITH DIFFERENTIAL/PLATELET
BASOS ABS: 0.1 10*3/uL (ref 0.0–0.1)
BASOS PCT: 1 % (ref 0–1)
Eosinophils Absolute: 0 10*3/uL (ref 0.0–0.7)
Eosinophils Relative: 1 % (ref 0–5)
HCT: 36.4 % (ref 36.0–46.0)
Hemoglobin: 12.2 g/dL (ref 12.0–15.0)
LYMPHS ABS: 1.7 10*3/uL (ref 0.7–4.0)
Lymphocytes Relative: 32 % (ref 12–46)
MCH: 31.1 pg (ref 26.0–34.0)
MCHC: 33.5 g/dL (ref 30.0–36.0)
MCV: 92.9 fL (ref 78.0–100.0)
Monocytes Absolute: 0.4 10*3/uL (ref 0.1–1.0)
Monocytes Relative: 7 % (ref 3–12)
NEUTROS PCT: 59 % (ref 43–77)
Neutro Abs: 3.1 10*3/uL (ref 1.7–7.7)
Platelets: 189 10*3/uL (ref 150–400)
RBC: 3.92 MIL/uL (ref 3.87–5.11)
RDW: 13.6 % (ref 11.5–15.5)
WBC: 5.2 10*3/uL (ref 4.0–10.5)

## 2014-10-05 LAB — COMPREHENSIVE METABOLIC PANEL
ALBUMIN: 4.2 g/dL (ref 3.5–5.0)
ALT: 14 U/L (ref 14–54)
AST: 18 U/L (ref 15–41)
Alkaline Phosphatase: 56 U/L (ref 38–126)
Anion gap: 8 (ref 5–15)
BUN: 6 mg/dL (ref 6–20)
CHLORIDE: 102 mmol/L (ref 101–111)
CO2: 25 mmol/L (ref 22–32)
Calcium: 9.4 mg/dL (ref 8.9–10.3)
Creatinine, Ser: 1.04 mg/dL — ABNORMAL HIGH (ref 0.44–1.00)
GFR, EST AFRICAN AMERICAN: 56 mL/min — AB (ref >60)
GFR, EST NON AFRICAN AMERICAN: 48 mL/min — AB (ref >60)
Glucose, Bld: 109 mg/dL — ABNORMAL HIGH (ref 65–99)
Potassium: 3.4 mmol/L — ABNORMAL LOW (ref 3.5–5.1)
Sodium: 135 mmol/L (ref 135–145)
TOTAL PROTEIN: 6.4 g/dL — AB (ref 6.5–8.1)
Total Bilirubin: 0.6 mg/dL (ref 0.3–1.2)

## 2014-10-05 LAB — URINALYSIS, ROUTINE W REFLEX MICROSCOPIC
BILIRUBIN URINE: NEGATIVE
Glucose, UA: NEGATIVE mg/dL
Ketones, ur: NEGATIVE mg/dL
Nitrite: NEGATIVE
PH: 7.5 (ref 5.0–8.0)
PROTEIN: NEGATIVE mg/dL
SPECIFIC GRAVITY, URINE: 1.004 — AB (ref 1.005–1.030)
Urobilinogen, UA: 0.2 mg/dL (ref 0.0–1.0)

## 2014-10-05 LAB — URINE MICROSCOPIC-ADD ON

## 2014-10-05 LAB — TROPONIN I: Troponin I: <0.03 ng/mL (ref <0.031)

## 2014-10-05 LAB — BRAIN NATRIURETIC PEPTIDE: B NATRIURETIC PEPTIDE 5: 246.7 pg/mL — AB (ref 0.0–100.0)

## 2014-10-05 MED ORDER — FUROSEMIDE 10 MG/ML IJ SOLN
20.0000 mg | Freq: Once | INTRAMUSCULAR | Status: AC
  Administered 2014-10-05: 20 mg via INTRAVENOUS
  Filled 2014-10-05: qty 2

## 2014-10-05 NOTE — Discharge Instructions (Signed)
Peripheral Edema °You have swelling in your legs (peripheral edema). This swelling is due to excess accumulation of salt and water in your body. Edema may be a sign of heart, kidney or liver disease, or a side effect of a medication. It may also be due to problems in the leg veins. Elevating your legs and using special support stockings may be very helpful, if the cause of the swelling is due to poor venous circulation. Avoid long periods of standing, whatever the cause. °Treatment of edema depends on identifying the cause. Chips, pretzels, pickles and other salty foods should be avoided. Restricting salt in your diet is almost always needed. Water pills (diuretics) are often used to remove the excess salt and water from your body via urine. These medicines prevent the kidney from reabsorbing sodium. This increases urine flow. °Diuretic treatment may also result in lowering of potassium levels in your body. Potassium supplements may be needed if you have to use diuretics daily. Daily weights can help you keep track of your progress in clearing your edema. You should call your caregiver for follow up care as recommended. °SEEK IMMEDIATE MEDICAL CARE IF:  °· You have increased swelling, pain, redness, or heat in your legs. °· You develop shortness of breath, especially when lying down. °· You develop chest or abdominal pain, weakness, or fainting. °· You have a fever. °Document Released: 04/10/2004 Document Revised: 05/26/2011 Document Reviewed: 03/21/2009 °ExitCare® Patient Information ©2015 [employer]. This information is not intended to replace advice given to you by your health care provider. Make sure you discuss any questions you have with your health care provider. ° °

## 2014-10-05 NOTE — ED Provider Notes (Addendum)
CSN: [id number]     Arrival date & time 10/05/14  1231 History   First MD Initiated Contact with Patient 10/05/14 1239     Chief Complaint  Patient presents with  . Leg Swelling     (Consider location/radiation/quality/duration/timing/severity/associated sxs/prior Treatment) HPI Comments: Patient presents to the emergency department for evaluation of bilateral leg swelling. Patient reports that for the last week she has been having swelling of both of her legs from the knees down. She reports that the area feels very tight. It is worse at the end of the day and that improves when she wakes up. She is not feeling any chest pain. She had some slight cough yesterday, but is not experiencing any shortness of breath.   Past Medical History  Diagnosis Date  . Hypertension   . Hypercalcemia   . Headache   . Hypokalemia    History reviewed. No pertinent past surgical history. History reviewed. No pertinent family history. History  Substance Use Topics  . Smoking status: Never Smoker   . Smokeless tobacco: Current User    Types: Snuff  . Alcohol Use: No   OB History    No data available     Review of Systems  Cardiovascular: Positive for leg swelling.  All other systems reviewed and are negative.     Allergies  Propranolol hcl; Benazepril hcl; Amlodipine besylate; and Trandolapril  Home Medications   Prior to Admission medications   Medication Sig Start Date End Date Taking? Authorizing Provider  amLODipine (NORVASC) 10 MG tablet Take 10 mg by mouth daily.   Yes Historical Provider, MD  Butalbital-APAP-Caffeine 50-300-40 MG CAPS Take 1 tablet by mouth as needed (migraines).  05/19/14  Yes Historical Provider, MD  diazepam (VALIUM) 5 MG tablet Take 2.5 mg by mouth 2 (two) times daily as needed for anxiety (anxiety).    Yes Historical Provider, MD  dorzolamide (TRUSOPT) 2 % ophthalmic solution Place 1 drop into both eyes 2 (two) times daily. 05/19/14  Yes Historical Provider, MD   furosemide (LASIX) 20 MG tablet Take 20 mg by mouth daily. Take with Klor-Con   Yes Historical Provider, MD  KLOR-CON M20 20 MEQ tablet Take 20 mEq by mouth daily. Take with Furosemide 09/08/14  Yes Historical Provider, MD  latanoprost (XALATAN) 0.005 % ophthalmic solution Place 1 drop into both eyes at bedtime. 05/19/14  Yes Historical Provider, MD  lisinopril (PRINIVIL,ZESTRIL) 40 MG tablet Take 40 mg by mouth daily. 05/05/14  Yes Historical Provider, MD  loratadine (CLARITIN) 10 MG tablet Take 10 mg by mouth daily as needed for allergies.   Yes Historical Provider, MD  meloxicam (MOBIC) 7.5 MG tablet Take 7.5 mg by mouth 2 (two) times daily as needed for pain (inflammation).  08/22/14  Yes Historical Provider, MD  metoprolol (LOPRESSOR) 50 MG tablet Take 50 mg by mouth 2 (two) times daily.   Yes Historical Provider, MD  pravastatin (PRAVACHOL) 40 MG tablet Take 40 mg by mouth daily as needed.    Yes Historical Provider, MD   BP 146/60 mmHg  Pulse 54  Temp(Src) 97.4 F (36.3 C) (Oral)  Resp 11  Ht 5\' 2"  (1.575 m)  Wt 130 lb 14.4 oz (59.376 kg)  BMI 23.94 kg/m2  SpO2 99% Physical Exam  Constitutional: She is oriented to person, place, and time. She appears well-developed and well-nourished. No distress.  HENT:  Head: Normocephalic and atraumatic.  Right Ear: Hearing normal.  Left Ear: Hearing normal.  Nose: Nose normal.  Mouth/Throat:  Oropharynx is clear and moist and mucous membranes are normal.  Eyes: Conjunctivae and EOM are normal. Pupils are equal, round, and reactive to light.  Neck: Normal range of motion. Neck supple.  Cardiovascular: Regular rhythm, S1 normal and S2 normal.  Exam reveals no gallop and no friction rub.   No murmur heard. Pulmonary/Chest: Effort normal and breath sounds normal. No respiratory distress. She exhibits no tenderness.  Abdominal: Soft. Normal appearance and bowel sounds are normal. There is no hepatosplenomegaly. There is no tenderness. There is no  rebound, no guarding, no tenderness at McBurney's point and negative Murphy's sign. No hernia.  Musculoskeletal: Normal range of motion. She exhibits edema (lateral pedal and lower legs, 1-2+).  Neurological: She is alert and oriented to person, place, and time. She has normal strength. No cranial nerve deficit or sensory deficit. Coordination normal. GCS eye subscore is 4. GCS verbal subscore is 5. GCS motor subscore is 6.  Skin: Skin is warm, dry and intact. No rash noted. No cyanosis.  Psychiatric: She has a normal mood and affect. Her speech is normal and behavior is normal. Thought content normal.  Nursing note and vitals reviewed.   ED Course  Procedures (including critical care time) Labs Review Labs Reviewed  COMPREHENSIVE METABOLIC PANEL - Abnormal; Notable for the following:    Potassium 3.4 (*)    Glucose, Bld 109 (*)    Creatinine, Ser 1.04 (*)    Total Protein 6.4 (*)    GFR calc non Af Amer 48 (*)    GFR calc Af Amer 56 (*)    All other components within normal limits  BRAIN NATRIURETIC PEPTIDE - Abnormal; Notable for the following:    B Natriuretic Peptide 246.7 (*)    All other components within normal limits  URINALYSIS, ROUTINE W REFLEX MICROSCOPIC (NOT AT [facility]) - Abnormal; Notable for the following:    Specific Gravity, Urine 1.004 (*)    Hgb urine dipstick TRACE (*)    Leukocytes, UA TRACE (*)    All other components within normal limits  CBC WITH DIFFERENTIAL/PLATELET  TROPONIN I  URINE MICROSCOPIC-ADD ON    Imaging Review Dg Chest 2 View  10/05/2014   CLINICAL DATA:  Bilateral foot and ankle swelling. Cough. Hoarseness.  EXAM: CHEST  2 VIEW  COMPARISON:  08/13/2013.  FINDINGS: Less prominent probable tortuosity of the innominate artery. Normal sized heart. Clear lungs. Thoracic spine degenerative changes and mild scoliosis. Diffuse osteopenia.  IMPRESSION: No acute abnormality.   Electronically Signed   By: Claudie Revering M.D.   On: 10/05/2014 13:30     EKG  Interpretation   Date/Time:  Thursday October 05 2014 13:02:34 EDT Ventricular Rate:  58 PR Interval:  163 QRS Duration: 84 QT Interval:  434 QTC Calculation: 426 R Axis:   39 Text Interpretation:  Sinus rhythm Atrial premature complexes Abnormal  R-wave progression, early transition No significant change since last  tracing Confirmed by POLLINA  MD, CHRISTOPHER [id number]) on 10/05/2014 1:08:30  PM      MDM   Final diagnoses:  Edema    Patient presents to the ER for evaluation of swelling of lower legs for one week. She reports that symptoms are worse at the end of the day and then improves after she lies down and sleeps. She does not have a history of congestive heart failure. There is no shortness of breath. Critical examination does not support diagnosis of congestive heart failure. Blood work was also reassuring area chest x-ray  does not show any edema. Patient administered IV Lasix here in the ER and has diuresed. Continue her home Lasix, rest, elevation of legs. Follow-up with PCP.    Orpah Greek, MD 10/05/14 [place], MD 10/05/14 [phone]

## 2014-10-05 NOTE — ED Notes (Signed)
Pt ambulated to restroom with steady gait.

## 2014-10-05 NOTE — ED Notes (Signed)
Pt presents with onset of BLE edema x 1 week; pt describes as tight; reports cough last night with some shortness of breath.  Pt denies any chest discomfort.

## 2014-10-05 NOTE — ED Notes (Signed)
Patient transported to X-ray 

## 2014-11-05 ENCOUNTER — Observation Stay (HOSPITAL_COMMUNITY)
Admission: EM | Admit: 2014-11-05 | Discharge: 2014-11-06 | Disposition: A | Discharge status: Home / Self Care | Payer: Medicare Other | Attending: Internal Medicine | Admitting: Internal Medicine

## 2014-11-05 ENCOUNTER — Encounter (HOSPITAL_COMMUNITY): Payer: Self-pay | Admitting: Emergency Medicine

## 2014-11-05 ENCOUNTER — Observation Stay (HOSPITAL_COMMUNITY): Payer: Medicare Other

## 2014-11-05 ENCOUNTER — Emergency Department (HOSPITAL_COMMUNITY): Payer: Medicare Other

## 2014-11-05 DIAGNOSIS — Z9181 History of falling: Secondary | ICD-10-CM

## 2014-11-05 DIAGNOSIS — F419 Anxiety disorder, unspecified: Secondary | ICD-10-CM | POA: Diagnosis not present

## 2014-11-05 DIAGNOSIS — Y92003 Bedroom of unspecified non-institutional (private) residence as the place of occurrence of the external cause: Secondary | ICD-10-CM | POA: Insufficient documentation

## 2014-11-05 DIAGNOSIS — W19XXXA Unspecified fall, initial encounter: Secondary | ICD-10-CM | POA: Diagnosis not present

## 2014-11-05 DIAGNOSIS — E871 Hypo-osmolality and hyponatremia: Secondary | ICD-10-CM | POA: Diagnosis not present

## 2014-11-05 DIAGNOSIS — Z79899 Other long term (current) drug therapy: Secondary | ICD-10-CM | POA: Insufficient documentation

## 2014-11-05 DIAGNOSIS — W06XXXA Fall from bed, initial encounter: Secondary | ICD-10-CM | POA: Diagnosis not present

## 2014-11-05 DIAGNOSIS — E876 Hypokalemia: Secondary | ICD-10-CM | POA: Insufficient documentation

## 2014-11-05 DIAGNOSIS — S065XAA Traumatic subdural hemorrhage with loss of consciousness status unknown, initial encounter: Secondary | ICD-10-CM

## 2014-11-05 DIAGNOSIS — S065X0A Traumatic subdural hemorrhage without loss of consciousness, initial encounter: Principal | ICD-10-CM | POA: Insufficient documentation

## 2014-11-05 DIAGNOSIS — E785 Hyperlipidemia, unspecified: Secondary | ICD-10-CM | POA: Diagnosis not present

## 2014-11-05 DIAGNOSIS — R51 Headache: Secondary | ICD-10-CM

## 2014-11-05 DIAGNOSIS — M542 Cervicalgia: Secondary | ICD-10-CM | POA: Diagnosis not present

## 2014-11-05 DIAGNOSIS — S065X9A Traumatic subdural hemorrhage with loss of consciousness of unspecified duration, initial encounter: Secondary | ICD-10-CM

## 2014-11-05 DIAGNOSIS — D329 Benign neoplasm of meninges, unspecified: Secondary | ICD-10-CM | POA: Diagnosis present

## 2014-11-05 DIAGNOSIS — I10 Essential (primary) hypertension: Secondary | ICD-10-CM | POA: Diagnosis present

## 2014-11-05 LAB — CBC WITH DIFFERENTIAL/PLATELET
Basophils Absolute: 0 K/uL (ref 0.0–0.1)
Basophils Relative: 1 % (ref 0–1)
Eosinophils Absolute: 0.1 K/uL (ref 0.0–0.7)
Eosinophils Relative: 2 % (ref 0–5)
HCT: 36.2 % (ref 36.0–46.0)
Hemoglobin: 12.6 g/dL (ref 12.0–15.0)
Lymphocytes Relative: 30 % (ref 12–46)
Lymphs Abs: 1.8 K/uL (ref 0.7–4.0)
MCH: 31.2 pg (ref 26.0–34.0)
MCHC: 34.8 g/dL (ref 30.0–36.0)
MCV: 89.6 fL (ref 78.0–100.0)
Monocytes Absolute: 0.5 K/uL (ref 0.1–1.0)
Monocytes Relative: 9 % (ref 3–12)
Neutro Abs: 3.4 K/uL (ref 1.7–7.7)
Neutrophils Relative %: 58 % (ref 43–77)
Platelets: 233 K/uL (ref 150–400)
RBC: 4.04 MIL/uL (ref 3.87–5.11)
RDW: 12.9 % (ref 11.5–15.5)
WBC: 5.8 K/uL (ref 4.0–10.5)

## 2014-11-05 LAB — BASIC METABOLIC PANEL
Anion gap: 12 (ref 5–15)
BUN: 12 mg/dL (ref 6–20)
CO2: 28 mmol/L (ref 22–32)
CREATININE: 1.01 mg/dL — AB (ref 0.44–1.00)
Calcium: 9.9 mg/dL (ref 8.9–10.3)
Chloride: 88 mmol/L — ABNORMAL LOW (ref 101–111)
GFR calc non Af Amer: 50 mL/min — ABNORMAL LOW (ref >60)
GFR, EST AFRICAN AMERICAN: 58 mL/min — AB (ref >60)
Glucose, Bld: 102 mg/dL — ABNORMAL HIGH (ref 65–99)
Potassium: 3 mmol/L — ABNORMAL LOW (ref 3.5–5.1)
Sodium: 128 mmol/L — ABNORMAL LOW (ref 135–145)

## 2014-11-05 LAB — OSMOLALITY, URINE: Osmolality, Ur: 213 mOsm/kg — ABNORMAL LOW (ref 390–1090)

## 2014-11-05 LAB — PROTIME-INR
INR: 0.95 (ref 0.00–1.49)
Prothrombin Time: 12.8 s (ref 11.6–15.2)

## 2014-11-05 LAB — SODIUM, URINE, RANDOM: SODIUM UR: 55 mmol/L

## 2014-11-05 LAB — OSMOLALITY: Osmolality: 263 mOsm/kg — ABNORMAL LOW (ref 275–300)

## 2014-11-05 MED ORDER — LATANOPROST 0.005 % OP SOLN
1.0000 [drp] | Freq: Every day | OPHTHALMIC | Status: DC
  Administered 2014-11-05: 1 [drp] via OPHTHALMIC
  Filled 2014-11-05: qty 2.5

## 2014-11-05 MED ORDER — DORZOLAMIDE HCL 2 % OP SOLN
1.0000 [drp] | Freq: Two times a day (BID) | OPHTHALMIC | Status: DC
  Administered 2014-11-05 – 2014-11-06 (×3): 1 [drp] via OPHTHALMIC
  Filled 2014-11-05: qty 10

## 2014-11-05 MED ORDER — METOPROLOL TARTRATE 50 MG PO TABS
50.0000 mg | ORAL_TABLET | Freq: Two times a day (BID) | ORAL | Status: DC
  Administered 2014-11-06: 50 mg via ORAL
  Filled 2014-11-05: qty 1

## 2014-11-05 MED ORDER — ACETAMINOPHEN 325 MG PO TABS
650.0000 mg | ORAL_TABLET | Freq: Four times a day (QID) | ORAL | Status: DC | PRN
  Administered 2014-11-05 – 2014-11-06 (×2): 650 mg via ORAL
  Filled 2014-11-05 (×2): qty 2

## 2014-11-05 MED ORDER — ACETAMINOPHEN 650 MG RE SUPP: 650.0000 mg | Freq: Four times a day (QID) | RECTAL | Status: DC | PRN

## 2014-11-05 MED ORDER — LISINOPRIL 20 MG PO TABS
40.0000 mg | ORAL_TABLET | Freq: Every day | ORAL | Status: DC
  Administered 2014-11-06: 40 mg via ORAL
  Filled 2014-11-05 (×2): qty 2

## 2014-11-05 MED ORDER — SODIUM CHLORIDE 0.9 % IJ SOLN: 3.0000 mL | Freq: Two times a day (BID) | INTRAMUSCULAR | Status: DC

## 2014-11-05 MED ORDER — SODIUM CHLORIDE 0.9 % IV SOLN
INTRAVENOUS | Status: DC
  Administered 2014-11-05: 14:00:00 via INTRAVENOUS

## 2014-11-05 NOTE — ED Notes (Signed)
Neurologist consulting at bedside. Husband present as well.

## 2014-11-05 NOTE — ED Notes (Signed)
Patient transported to CT 

## 2014-11-05 NOTE — ED Notes (Signed)
Patient denies pain and is resting comfortably.  

## 2014-11-05 NOTE — H&P (Signed)
Internal Medicine Attending Admission Note  I saw and evaluated the patient. I reviewed the resident's note and I agree with the resident's findings and plan as documented in the resident's note.  Assessment & Plan by Problem:  Principal Problem:   Subdural hematoma, acute Active Problems:   Essential hypertension   Subdural hematoma  Subdural Hematoma: Currently doing well with a stable neurologic exam. Appreciate neurosurgery involvement. Plan is for repeat CT of the head later today to evaluate for progression of the small bleed. I doubt it will be much larger because she's not on antiplatelets or anticoagulation.  Fall: Patient's is at high risk for falls. Home high risk medication is diazepam, which we will stop. Other risk factor for falls is mild hyponatremia. This is likely related to thiazide and loop diuretic. Currently she appears euvolemic, so we will hold these medicines and recheck tomorrow. PT and OT consults to be placed.    Chief Complaint(s): Fall  History - key components related to admission:  81 year old woman who was brought into the emergency department last night by her husband after experiencing a fall. They report that she was sleeping when she had a bad dream and he got up out of bed and fell to the floor. Hit her head on the ground.  Lab results: Reviewed in Epic  Physical Exam - key components related to admission:  Filed Vitals:   11/05/14 0530 11/05/14 0545 11/05/14 0600 11/05/14 0904  BP: 166/67 150/65 142/58 152/66  Pulse: 59 52 52 81  Temp:      TempSrc:      Resp: 16 21 12 20   Height:      Weight:      SpO2: 100% 100% 100% 100%   Gen: Well-appearing older woman, lying in bed, no distress ENT: MMM CV: Regular rate and rhythm with no murmurs Lungs: Unlabored, clear to auscultation throughout Abd: Soft and nontender, nondistended Ext: Warm and well-perfused with no edema Neuro: Alert, oriented, conversational, cranial nerves are intact with  the exception of a left eye gaze deficit to the left side. Strength is normal in the upper and lower extremities.

## 2014-11-05 NOTE — ED Notes (Signed)
PA in room

## 2014-11-05 NOTE — H&P (Signed)
Date: 11/05/2014               Patient Name:  Adrienne Bright MRN: [id number]  DOB: [birth year] Age / Sex: 81 y.o., female   PCP: Luna Fuse, MD         Medical Service: Internal Medicine Teaching Service         Attending Physician: Dr. Lalla Brothers    First Contact: Dr. Liberty Handy Pager: [phone]  Second Contact: Dr. Duwaine Maxin Pager: [phone]       After Hours (After 5p/  First Contact Pager: [phone]  weekends / holidays): Second Contact Pager: [phone]   Chief Complaint: Fall and Head Injury  History of Present Illness: Adrienne Bright is an 81 year old women with PMH of HTN and chronic headaches who presents after hitting her head after falling out of bed this morning. She was joined by her husband at bedside who helped relay this history. She said she was having a nightmare and the next thing she knew she had fallen out of bed and hit the right side on the back of her head on the wood floor. She was wearing a night cap at the time which provided some minimal cushioning. She said it did not lead to any bleeding, and she simply took a Fiorcet, for which she takes chronic headaches, and went back to bed. However she woke up and she said that she just didn't "feel right." She reports a moderate posterior headache that has not been worsening. She denies fevers, chills, chest pain, shortness of breath, abdominal pain, nausea, vomiting, loss of bowel or urinary control, new weakness or changes in sensation, confusion, seizure-like activity, and loss of consciousness. The husband confirms that she is acting how show normally does. She is not on any antiplatelet therapy or anticoagulation.  She reports a general loss of appetite and "not feeling right" since taking hydrochlorothiazide for her blood pressure. She said that that medication also makes her feel constipated. She also says she has had longstanding headaches for years. She says that come on at night, last about 30 minutes, and  are improved after taking Fiorecet. She also takes Valium every other day, which she has been taking for years, for anxiety.  In the ED, a CT of the head was obtained which revealed a 5 mm left frontal subdural hematoma. Notably, there was a C3-4 disc protrusion with ventral cord mass effect.  Meds: No current facility-administered medications for this encounter.   Current Outpatient Prescriptions  Medication Sig Dispense Refill  . amLODipine (NORVASC) 10 MG tablet Take 10 mg by mouth daily.    . Butalbital-APAP-Caffeine 50-300-40 MG CAPS Take 1 tablet by mouth as needed (migraines).   5  . diazepam (VALIUM) 5 MG tablet Take 2.5 mg by mouth 2 (two) times daily as needed for anxiety (anxiety).     . dorzolamide (TRUSOPT) 2 % ophthalmic solution Place 1 drop into both eyes 2 (two) times daily.  12  . furosemide (LASIX) 20 MG tablet Take 20 mg by mouth daily. Take with Klor-Con    . hydrochlorothiazide (MICROZIDE) 12.5 MG capsule Take 12.5 mg by mouth daily.    Marland Kitchen KLOR-CON M20 20 MEQ tablet Take 20 mEq by mouth daily. Take with Furosemide  5  . latanoprost (XALATAN) 0.005 % ophthalmic solution Place 1 drop into both eyes at bedtime.  11  . lisinopril (PRINIVIL,ZESTRIL) 40 MG tablet Take 40 mg by mouth daily.  5  . loratadine (CLARITIN)  10 MG tablet Take 10 mg by mouth daily as needed for allergies.    . meloxicam (MOBIC) 7.5 MG tablet Take 7.5 mg by mouth 2 (two) times daily as needed for pain (inflammation).   2  . metoprolol (LOPRESSOR) 50 MG tablet Take 50 mg by mouth 2 (two) times daily.    . pravastatin (PRAVACHOL) 40 MG tablet Take 40 mg by mouth every other day.       Allergies: Allergies as of 11/05/2014 - Review Complete 11/05/2014  Allergen Reaction Noted  . Propranolol hcl Anxiety and Palpitations   . Benazepril hcl Nausea Only and Other (See Comments)   . Amlodipine besylate Other (See Comments)   . Trandolapril Cough    Past Medical History  Diagnosis Date  . Hypertension     . Hypercalcemia   . Headache   . Hypokalemia    History reviewed. No pertinent past surgical history. History reviewed. No pertinent family history. Social History   Social History  . Marital Status: Married    Spouse Name: N/A  . Number of Children: N/A  . Years of Education: N/A   Occupational History  . Not on file.   Social History Main Topics  . Smoking status: Never Smoker   . Smokeless tobacco: Current User    Types: Snuff  . Alcohol Use: No  . Drug Use: No  . Sexual Activity: Not on file   Other Topics Concern  . Not on file   Social History Narrative    Review of Systems: Negative except per above  Physical Exam: Blood pressure 152/66, pulse 81, temperature 98.6 F (37 C), temperature source Oral, resp. rate 20, height 5\' 2"  (1.575 m), weight 131 lb 2 oz (59.478 kg), SpO2 100 %.  General:  Lying in bed, NAD. HEENT:  Area of bilateral erythema noted in the suboccipital region. No bleeding or lacerations noted.Pupils were equal round and reactive to light. No nystagmus, extraocular muscles in tact, Full range of motion of neck without any bony tenderness. No Battle's sign or raccoon eyes. She has a longstanding bilateral rash in the superior maxillary area. Cardiovascular:  RRR, no murmurs. Normal S1, S2. Pulmonary:  Clear to auscultation without wheezes or crackles. Moving good volumes of air without evidence of compromised diaphragmatic excursions Ext: Arthritic changes noted in digits. Otherwise, no clubbing, cyanosis, or edema  Neurological Examination:  Mental status: AAOx3  Cranial nerves: CN II: Pupils are briskly reactive to light.  CN III, IV, VI: EOMI, no nystagmus CN V: Normal facial sensation CN VII: Face is symmetric with normal eye closure and smile. CN IX, X: Phonation is normal..  Motor: Muscle bulk and tone are normal.  Grip Strength: L - 5/5, R - 5/5 Biceps: L - 5/5, R - 5/5 Hip Flexion: L - 5/5, R - 5/5 Hip Extension: L - 5/5,  R - 5/5 Knee Flexion: L - 5/5, R - 5/5 Knee Extension: L - 5/5, R - 5/5 Ankle Flexion: L - 5/5, R - 5/5 Angle Extension: L - 5/5, R - 5/5  Sensory: Sensation to light touch in tact throughout  Cognition: Alert, oriented, and cooperative to exam. Able to recall 3 of 3 items. Can spell WORLD forwards but too tired to spell backwards  Lab results: Basic Metabolic Panel:  Recent Labs  11/05/14 0700  NA 128*  K 3.0*  CL 88*  CO2 28  GLUCOSE 102*  BUN 12  CREATININE 1.01*  CALCIUM 9.9    CBC:  Recent Labs  11/05/14 0700  WBC 5.8  NEUTROABS 3.4  HGB 12.6  HCT 36.2  MCV 89.6  PLT 233    Recent Labs  11/05/14 0700  LABPROT 12.8  INR 0.95     Imaging results:  Ct Head Wo Contrast  11/05/2014   CLINICAL DATA:  Fall from bed hitting the head on hardwood floor. Headache and neck pain after fall. Initial encounter.  EXAM: CT HEAD WITHOUT CONTRAST  CT CERVICAL SPINE WITHOUT CONTRAST  TECHNIQUE: Multidetector CT imaging of the head and cervical spine was performed following the standard protocol without intravenous contrast. Multiplanar CT image reconstructions of the cervical spine were also generated.  COMPARISON:  04/25/2006 head CT  FINDINGS: CT HEAD FINDINGS  Skull and Sinuses:Negative for fracture or destructive process. The mastoids, middle ears, and imaged paranasal sinuses are clear.  Orbits: No acute abnormality.  Brain: High-density extra-axial abnormality at the left frontal pole measuring 5 mm in thickness. Although not as dense as usually seen with acute clot, this is most consistent with subdural hemorrhage given clinical circumstances and shape. Mass effect the left frontal lobe is minimal. No subarachnoid hemorrhage. No evidence of acute infarct, hydrocephalus, or shift.  There is cerebral volume loss in keeping with age, progressed since 2008. Mild small vessel disease, pattern similar to 2008, best seen around the lateral ventricles.  Critical Value/emergent  results were called by telephone at the time of interpretation on 11/05/2014 at 6:50 am to Dr. Everlene Balls , who verbally acknowledged these results.  CT CERVICAL SPINE FINDINGS  No evidence of acute fracture traumatic malalignment.  Diffuse degenerative disc disease with disc narrowing greatest at C5-6 and C6-7. There is a moderate central disc protrusion with mass effect on the ventral cord.  No evidence of canal hematoma or prevertebral edema.  IMPRESSION: 1. Thin (10mm) left frontal subdural hematoma 2. No acute cervical spine finding. 3. Notable C3-4 disc protrusion with ventral cord mass effect.   Electronically Signed   By: Monte Fantasia M.D.   On: 11/05/2014 06:53   Ct Cervical Spine Wo Contrast  11/05/2014   CLINICAL DATA:  Fall from bed hitting the head on hardwood floor. Headache and neck pain after fall. Initial encounter.  EXAM: CT HEAD WITHOUT CONTRAST  CT CERVICAL SPINE WITHOUT CONTRAST  TECHNIQUE: Multidetector CT imaging of the head and cervical spine was performed following the standard protocol without intravenous contrast. Multiplanar CT image reconstructions of the cervical spine were also generated.  COMPARISON:  04/25/2006 head CT  FINDINGS: CT HEAD FINDINGS  Skull and Sinuses:Negative for fracture or destructive process. The mastoids, middle ears, and imaged paranasal sinuses are clear.  Orbits: No acute abnormality.  Brain: High-density extra-axial abnormality at the left frontal pole measuring 5 mm in thickness. Although not as dense as usually seen with acute clot, this is most consistent with subdural hemorrhage given clinical circumstances and shape. Mass effect the left frontal lobe is minimal. No subarachnoid hemorrhage. No evidence of acute infarct, hydrocephalus, or shift.  There is cerebral volume loss in keeping with age, progressed since 2008. Mild small vessel disease, pattern similar to 2008, best seen around the lateral ventricles.  Critical Value/emergent results were  called by telephone at the time of interpretation on 11/05/2014 at 6:50 am to Dr. Everlene Balls , who verbally acknowledged these results.  CT CERVICAL SPINE FINDINGS  No evidence of acute fracture traumatic malalignment.  Diffuse degenerative disc disease with disc narrowing greatest at C5-6 and C6-7. There  is a moderate central disc protrusion with mass effect on the ventral cord.  No evidence of canal hematoma or prevertebral edema.  IMPRESSION: 1. Thin (27mm) left frontal subdural hematoma 2. No acute cervical spine finding. 3. Notable C3-4 disc protrusion with ventral cord mass effect.   Electronically Signed   By: Monte Fantasia M.D.   On: 11/05/2014 06:53    Assessment & Plan by Problem:  Subdural Hematoma: She is neurologically in tact without changes in consciousness or mental status. The left frontal subdural hematoma, however, does require monitoring to assess for progression. Without progression, the SDH will likely resolve without intervention. -Repeat Head CT at 2:30pm today, eight hours previous CT scan -Monitor for acute changes in neurological status -Neurosurgery has not yet seen the patient but is aware. Recommends imaging per above  Hyponatremia and hypokalemia: Differential includes diuretic effect, SIADH, and cerebral salt wasting. Likely related to to diuretic effect, since she also take potassium pills for longstanding hypokalemia Appears euvolemic on exam, has not reported increased urinary frequency, and the event only occurred this morning, so CSW is lower on the differential. - BMET in the AM - Hold furosemide and HCTZ  Chronic Headaches (unspecified): Take   Anxiety: Patient takes 5 mg diazepam about every other day.   Hypertension: Holding furosemide and HCTZ in setting of electrolyte abnormalities. - Continue home lisinopril 40 mg daily  Hyperlipidemia: Continue home pravastatin 40 mg  Dispo: Disposition is deferred at this time, awaiting improvement of current  medical problems. Anticipated discharge in approximately 1-2 day(s).   The patient does have a current PCP Luna Fuse, MD) and does not need an [facility] hospital follow-up appointment after discharge.  The patient does not have transportation limitations that hinder transportation to clinic appointments.  Signed: Liberty Handy, MD 11/05/2014, 10:41 AM

## 2014-11-05 NOTE — Consult Note (Signed)
CC: Fall Chief Complaint  Patient presents with  . Fall  . Head Injury    HPI: Adrienne Bright is a 81 y.o. female fell out of bed and struck her head on the floor.  No loss of consciousness.  Complains of headache.  Denies neck pain.  Denies weakness or numbness.  No language symptoms.  PMH: Past Medical History  Diagnosis Date  . Hypertension   . Hypercalcemia   . Headache   . Hypokalemia     PSH: History reviewed. No pertinent past surgical history.  SH: Social History  Substance Use Topics  . Smoking status: Never Smoker   . Smokeless tobacco: Current User    Types: Snuff  . Alcohol Use: No    MEDS: Prior to Admission medications   Medication Sig Start Date End Date Taking? Authorizing Provider  amLODipine (NORVASC) 10 MG tablet Take 10 mg by mouth daily.   Yes Historical Provider, MD  Butalbital-APAP-Caffeine 50-300-40 MG CAPS Take 1 tablet by mouth as needed (migraines).  05/19/14  Yes Historical Provider, MD  diazepam (VALIUM) 5 MG tablet Take 2.5 mg by mouth 2 (two) times daily as needed for anxiety (anxiety).    Yes Historical Provider, MD  dorzolamide (TRUSOPT) 2 % ophthalmic solution Place 1 drop into both eyes 2 (two) times daily. 05/19/14  Yes Historical Provider, MD  furosemide (LASIX) 20 MG tablet Take 20 mg by mouth daily. Take with Klor-Con   Yes Historical Provider, MD  hydrochlorothiazide (MICROZIDE) 12.5 MG capsule Take 12.5 mg by mouth daily.   Yes Historical Provider, MD  KLOR-CON M20 20 MEQ tablet Take 20 mEq by mouth daily. Take with Furosemide 09/08/14  Yes Historical Provider, MD  latanoprost (XALATAN) 0.005 % ophthalmic solution Place 1 drop into both eyes at bedtime. 05/19/14  Yes Historical Provider, MD  lisinopril (PRINIVIL,ZESTRIL) 40 MG tablet Take 40 mg by mouth daily. 05/05/14  Yes Historical Provider, MD  loratadine (CLARITIN) 10 MG tablet Take 10 mg by mouth daily as needed for allergies.   Yes Historical Provider, MD  meloxicam (MOBIC) 7.5  MG tablet Take 7.5 mg by mouth 2 (two) times daily as needed for pain (inflammation).  08/22/14  Yes Historical Provider, MD  metoprolol (LOPRESSOR) 50 MG tablet Take 50 mg by mouth 2 (two) times daily.   Yes Historical Provider, MD  pravastatin (PRAVACHOL) 40 MG tablet Take 40 mg by mouth every other day.    Yes Historical Provider, MD    ALLERGY: Allergies  Allergen Reactions  . Propranolol Hcl Anxiety and Palpitations  . Benazepril Hcl Nausea Only and Other (See Comments)    REACTION: also dizziness  . Amlodipine Besylate Other (See Comments)    REACTION: questionable intolerance  . Trandolapril Cough    ROS: ROS Headache.  Denies neck pain.  No chest pain.  No shortness of breath.  No abdominal pain or nausea.  No fevers or chills.  NEUROLOGIC EXAM: Awake, alert, oriented Memory and concentration grossly intact Speech fluent, appropriate CN grossly intact Motor exam: Upper Extremities Deltoid Bicep Tricep Grip  Right 5/5 5/5 5/5 5/5  Left 5/5 5/5 5/5 5/5   Lower Extremity IP Quad PF DF EHL  Right 5/5 5/5 5/5 5/5 5/5  Left 5/5 5/5 5/5 5/5 5/5   Sensation grossly intact to LT  IMGAING: CT Head and C-spine: Small left frontal subdural.  No significant mass effect.  C3-4 disc herniation.  No fracture or subluxation.  IMPRESSION: - 81 y.o. female with  acute left frontal subdural hematoma.  Neurologically intact.  Asymptomatic disc herniation.  PLAN: - Repeat CT scan after 8 hours.  If stable patient can be discharged today or can stay for overnight observation.  I'll leave that to the discretion of the primary team.

## 2014-11-05 NOTE — ED Notes (Signed)
Report received from Lennette Bihari, RN at pt bedside at this time. Pt is very pleasant and VS and orders assessed and pt has no s/s of distress. Husband is pleasant and at the bedside and pt denies complaints. Bed in low position and side rails are in place for safety. Will continue to monitor and tx pt according to MD orders.

## 2014-11-05 NOTE — ED Notes (Signed)
Pt is alert, calm and has no s/s of distress. VS and orders assessed and pt denies complaints. Husband remains at the bedside. Will continue to monitor and tx pt according to MD orders.

## 2014-11-05 NOTE — ED Provider Notes (Signed)
CSN: [id number]     Arrival date & time 11/05/14  0119 History   First MD Initiated Contact with Patient 11/05/14 [phone]     Chief Complaint  Patient presents with  . Fall  . Head Injury     (Consider location/radiation/quality/duration/timing/severity/associated sxs/prior Treatment) HPI Adrienne Bright is a 81 y.o. female with a history of hypertension and previous headaches comes in for evaluation for a fall and head injury. Patient states at approximately 12:00 this morning, she had a bad dream and fell out of her bed, hitting her head on the wood floor. Patient is accompanied by husband at bedside, who reports patient is at baseline. Patient at this time reports a moderate posterior headache, different from previous headaches, and that "I just don't feel right". She denies any vision changes, neck pain, dizziness, numbness or weakness. No anticoagulation. Also denies fevers, chills, chest pain, shortness of breath, abdominal pain, nausea or vomiting.  Past Medical History  Diagnosis Date  . Hypertension   . Hypercalcemia   . Headache   . Hypokalemia    History reviewed. No pertinent past surgical history. History reviewed. No pertinent family history. Social History  Substance Use Topics  . Smoking status: Never Smoker   . Smokeless tobacco: Current User    Types: Snuff  . Alcohol Use: No   OB History    No data available     Review of Systems A 10 point review of systems was completed and was negative except for pertinent positives and negatives as mentioned in the history of present illness     Allergies  Propranolol hcl; Benazepril hcl; Amlodipine besylate; and Trandolapril  Home Medications   Prior to Admission medications   Medication Sig Start Date End Date Taking? Authorizing Provider  amLODipine (NORVASC) 10 MG tablet Take 10 mg by mouth daily.   Yes Historical Provider, MD  Butalbital-APAP-Caffeine 50-300-40 MG CAPS Take 1 tablet by mouth as needed  (migraines).  05/19/14  Yes Historical Provider, MD  diazepam (VALIUM) 5 MG tablet Take 2.5 mg by mouth 2 (two) times daily as needed for anxiety (anxiety).    Yes Historical Provider, MD  dorzolamide (TRUSOPT) 2 % ophthalmic solution Place 1 drop into both eyes 2 (two) times daily. 05/19/14  Yes Historical Provider, MD  furosemide (LASIX) 20 MG tablet Take 20 mg by mouth daily. Take with Klor-Con   Yes Historical Provider, MD  hydrochlorothiazide (MICROZIDE) 12.5 MG capsule Take 12.5 mg by mouth daily.   Yes Historical Provider, MD  KLOR-CON M20 20 MEQ tablet Take 20 mEq by mouth daily. Take with Furosemide 09/08/14  Yes Historical Provider, MD  latanoprost (XALATAN) 0.005 % ophthalmic solution Place 1 drop into both eyes at bedtime. 05/19/14  Yes Historical Provider, MD  lisinopril (PRINIVIL,ZESTRIL) 40 MG tablet Take 40 mg by mouth daily. 05/05/14  Yes Historical Provider, MD  loratadine (CLARITIN) 10 MG tablet Take 10 mg by mouth daily as needed for allergies.   Yes Historical Provider, MD  meloxicam (MOBIC) 7.5 MG tablet Take 7.5 mg by mouth 2 (two) times daily as needed for pain (inflammation).  08/22/14  Yes Historical Provider, MD  metoprolol (LOPRESSOR) 50 MG tablet Take 50 mg by mouth 2 (two) times daily.   Yes Historical Provider, MD  pravastatin (PRAVACHOL) 40 MG tablet Take 40 mg by mouth every other day.    Yes Historical Provider, MD   BP 142/58 mmHg  Pulse 52  Temp(Src) 98.6 F (37 C) (Oral)  Resp  12  Ht 5\' 2"  (1.575 m)  Wt 131 lb 2 oz (59.478 kg)  BMI 23.98 kg/m2  SpO2 100% Physical Exam  Constitutional: She is oriented to person, place, and time. She appears well-developed and well-nourished.  GCS 15, answers questions appropriately. At baseline per her husband in the room.  HENT:  Head: Normocephalic and atraumatic.  Mouth/Throat: Oropharynx is clear and moist.  No sign of basilar skull fracture, no Battle's sign or raccoon eyes. No hemotympanum  Eyes: Conjunctivae are normal.  Pupils are equal, round, and reactive to light. Right eye exhibits no discharge. Left eye exhibits no discharge. No scleral icterus.  No nystagmus, extraocular movements intact  Neck: Normal range of motion. Neck supple.  Full active range of motion of cervical spine without any bony tenderness  Cardiovascular: Normal rate, regular rhythm and normal heart sounds.   Pulmonary/Chest: Effort normal and breath sounds normal. No respiratory distress. She has no wheezes. She has no rales.  Abdominal: Soft. There is no tenderness.  Musculoskeletal: Normal range of motion. She exhibits no tenderness.  Neurological: She is alert and oriented to person, place, and time.  Cranial Nerves II-XII grossly intact. Motor and sensation 5/5 in all 4 extremities. No focal neurodeficits  Skin: Skin is warm and dry. No rash noted.  Psychiatric: She has a normal mood and affect.  Nursing note and vitals reviewed.   ED Course  Procedures (including critical care time) Labs Review Labs Reviewed  CBC WITH DIFFERENTIAL/PLATELET  BASIC METABOLIC PANEL  PROTIME-INR    Imaging Review Ct Head Wo Contrast  11/05/2014   CLINICAL DATA:  Fall from bed hitting the head on hardwood floor. Headache and neck pain after fall. Initial encounter.  EXAM: CT HEAD WITHOUT CONTRAST  CT CERVICAL SPINE WITHOUT CONTRAST  TECHNIQUE: Multidetector CT imaging of the head and cervical spine was performed following the standard protocol without intravenous contrast. Multiplanar CT image reconstructions of the cervical spine were also generated.  COMPARISON:  04/25/2006 head CT  FINDINGS: CT HEAD FINDINGS  Skull and Sinuses:Negative for fracture or destructive process. The mastoids, middle ears, and imaged paranasal sinuses are clear.  Orbits: No acute abnormality.  Brain: High-density extra-axial abnormality at the left frontal pole measuring 5 mm in thickness. Although not as dense as usually seen with acute clot, this is most consistent with  subdural hemorrhage given clinical circumstances and shape. Mass effect the left frontal lobe is minimal. No subarachnoid hemorrhage. No evidence of acute infarct, hydrocephalus, or shift.  There is cerebral volume loss in keeping with age, progressed since 2008. Mild small vessel disease, pattern similar to 2008, best seen around the lateral ventricles.  Critical Value/emergent results were called by telephone at the time of interpretation on 11/05/2014 at 6:50 am to Dr. Everlene Balls , who verbally acknowledged these results.  CT CERVICAL SPINE FINDINGS  No evidence of acute fracture traumatic malalignment.  Diffuse degenerative disc disease with disc narrowing greatest at C5-6 and C6-7. There is a moderate central disc protrusion with mass effect on the ventral cord.  No evidence of canal hematoma or prevertebral edema.  IMPRESSION: 1. Thin (75mm) left frontal subdural hematoma 2. No acute cervical spine finding. 3. Notable C3-4 disc protrusion with ventral cord mass effect.   Electronically Signed   By: Monte Fantasia M.D.   On: 11/05/2014 06:53   Ct Cervical Spine Wo Contrast  11/05/2014   CLINICAL DATA:  Fall from bed hitting the head on hardwood floor. Headache and neck  pain after fall. Initial encounter.  EXAM: CT HEAD WITHOUT CONTRAST  CT CERVICAL SPINE WITHOUT CONTRAST  TECHNIQUE: Multidetector CT imaging of the head and cervical spine was performed following the standard protocol without intravenous contrast. Multiplanar CT image reconstructions of the cervical spine were also generated.  COMPARISON:  04/25/2006 head CT  FINDINGS: CT HEAD FINDINGS  Skull and Sinuses:Negative for fracture or destructive process. The mastoids, middle ears, and imaged paranasal sinuses are clear.  Orbits: No acute abnormality.  Brain: High-density extra-axial abnormality at the left frontal pole measuring 5 mm in thickness. Although not as dense as usually seen with acute clot, this is most consistent with subdural  hemorrhage given clinical circumstances and shape. Mass effect the left frontal lobe is minimal. No subarachnoid hemorrhage. No evidence of acute infarct, hydrocephalus, or shift.  There is cerebral volume loss in keeping with age, progressed since 2008. Mild small vessel disease, pattern similar to 2008, best seen around the lateral ventricles.  Critical Value/emergent results were called by telephone at the time of interpretation on 11/05/2014 at 6:50 am to Dr. Everlene Balls , who verbally acknowledged these results.  CT CERVICAL SPINE FINDINGS  No evidence of acute fracture traumatic malalignment.  Diffuse degenerative disc disease with disc narrowing greatest at C5-6 and C6-7. There is a moderate central disc protrusion with mass effect on the ventral cord.  No evidence of canal hematoma or prevertebral edema.  IMPRESSION: 1. Thin (37mm) left frontal subdural hematoma 2. No acute cervical spine finding. 3. Notable C3-4 disc protrusion with ventral cord mass effect.   Electronically Signed   By: Monte Fantasia M.D.   On: 11/05/2014 06:53   I have personally reviewed and evaluated these images and lab results as part of my medical decision-making.   EKG Interpretation None     Meds given in ED:  Medications - No data to display  New Prescriptions   No medications on file   Filed Vitals:   11/05/14 0128 11/05/14 0530 11/05/14 0545 11/05/14 0600  BP: 151/72 166/67 150/65 142/58  Pulse: 64 59 52 52  Temp: 98.6 F (37 C)     TempSrc: Oral     Resp: 12 16 21 12   Height: 5\' 2"  (1.575 m)     Weight: 131 lb 2 oz (59.478 kg)     SpO2: 100% 100% 100% 100%   CRITICAL CARE Performed by: Verl Dicker   Total critical care time: 35  Critical care time was exclusive of separately billable procedures and treating other patients.  Critical care was necessary to treat or prevent imminent or life-threatening deterioration.  Critical care was time spent personally by me on the following  activities: development of treatment plan with patient and/or surrogate as well as nursing, discussions with consultants, evaluation of patient's response to treatment, examination of patient, obtaining history from patient or surrogate, ordering and performing treatments and interventions, ordering and review of laboratory studies, ordering and review of radiographic studies, pulse oximetry and re-evaluation of patient's condition.   MDM  Vitals stable - WNL -afebrile Pt resting comfortably in ED. PE--normal neuro exam, remainder physical exam benign. Labwork--pending CBC, BMP and INR Imaging--CT head shows thin (5 mm) left frontal subdural hematoma. No acute cervical spine findings, however there is a notable C3-4 disc protrusion with ventral cord mass effect.  Patient here for headache following a fall from bed. No anticoagulation. Discussed patient presentation and ED course the my attending, Dr. Claudine Mouton, who will consult neurosurgery for  further evaluation and management of patient's symptoms. Neurosurgery, Dr. Cyndy Freeze, recommends admission to medicine service with repeat CT in 8hrs. Consult to Medicine, will see in ED. Patient medicine to internal medicine service.  Final diagnoses:  Subdural hematoma        Comer Locket, PA-C 11/05/14 1104  Everlene Balls, MD 11/05/14 [phone]

## 2014-11-05 NOTE — ED Notes (Signed)
Vital signs stable. 

## 2014-11-05 NOTE — ED Notes (Addendum)
Patient states she had a bad dream, was startled awake and fell out of bed, hitting her head on the right side. Patient states that the floor was wood. Patient states that she doesn't really have pain, but that her head "just doesn't feel right". I observed no signs of bruising or swelling on head. Patient denies dizziness when standing. Patients pupils were PERRLA and track appropriately.

## 2014-11-05 NOTE — Progress Notes (Signed)
Pt is admitted to 5M11. Admission vital is stable

## 2014-11-05 NOTE — ED Notes (Signed)
Husband at bedside.  

## 2014-11-05 NOTE — Progress Notes (Signed)
Repeat CT reviewed.  No change in size of subdural hematoma. Will sign off. Follow up with me in clinic in four weeks with non-contrast head CT. Please feel free to call with questions.

## 2014-11-06 DIAGNOSIS — E871 Hypo-osmolality and hyponatremia: Secondary | ICD-10-CM

## 2014-11-06 DIAGNOSIS — S065X0A Traumatic subdural hemorrhage without loss of consciousness, initial encounter: Secondary | ICD-10-CM | POA: Diagnosis not present

## 2014-11-06 DIAGNOSIS — I6201 Nontraumatic acute subdural hemorrhage: Secondary | ICD-10-CM | POA: Diagnosis not present

## 2014-11-06 LAB — CBC
HCT: 32 % — ABNORMAL LOW (ref 36.0–46.0)
HCT: 34.5 % — ABNORMAL LOW (ref 36.0–46.0)
HEMOGLOBIN: 10.9 g/dL — AB (ref 12.0–15.0)
HEMOGLOBIN: 11.7 g/dL — AB (ref 12.0–15.0)
MCH: 30.9 pg (ref 26.0–34.0)
MCH: 31 pg (ref 26.0–34.0)
MCHC: 34.1 g/dL (ref 30.0–36.0)
MCHC: 33.9 g/dL (ref 30.0–36.0)
MCV: 90.7 fL (ref 78.0–100.0)
MCV: 91.3 fL (ref 78.0–100.0)
Platelets: 221 10*3/uL (ref 150–400)
Platelets: 242 10*3/uL (ref 150–400)
RBC: 3.53 MIL/uL — AB (ref 3.87–5.11)
RBC: 3.78 MIL/uL — AB (ref 3.87–5.11)
RDW: 13.3 % (ref 11.5–15.5)
RDW: 13.3 % (ref 11.5–15.5)
WBC: 4.9 10*3/uL (ref 4.0–10.5)
WBC: 6.3 10*3/uL (ref 4.0–10.5)

## 2014-11-06 LAB — BASIC METABOLIC PANEL
ANION GAP: 6 (ref 5–15)
BUN: 7 mg/dL (ref 6–20)
CHLORIDE: 95 mmol/L — AB (ref 101–111)
CO2: 27 mmol/L (ref 22–32)
Calcium: 8.9 mg/dL (ref 8.9–10.3)
Creatinine, Ser: 0.81 mg/dL (ref 0.44–1.00)
GFR calc Af Amer: >60 mL/min (ref >60)
GFR calc non Af Amer: >60 mL/min (ref >60)
GLUCOSE: 101 mg/dL — AB (ref 65–99)
POTASSIUM: 3.4 mmol/L — AB (ref 3.5–5.1)
Sodium: 128 mmol/L — ABNORMAL LOW (ref 135–145)

## 2014-11-06 LAB — CORTISOL: CORTISOL PLASMA: 17.5 ug/dL

## 2014-11-06 LAB — TSH: TSH: 0.909 u[IU]/mL (ref 0.350–4.500)

## 2014-11-06 MED ORDER — AMLODIPINE BESYLATE 10 MG PO TABS
10.0000 mg | ORAL_TABLET | Freq: Every day | ORAL | Status: DC
  Administered 2014-11-06: 10 mg via ORAL
  Filled 2014-11-06: qty 1

## 2014-11-06 MED ORDER — ACETAMINOPHEN 325 MG PO TABS: 650.0000 mg | ORAL_TABLET | Freq: Four times a day (QID) | ORAL | Status: AC | PRN

## 2014-11-06 NOTE — Progress Notes (Addendum)
Subjective:  Adrienne Bright was seen and examined this morning. She said that continued to "just not feel right," although this appears have been longstanding. She was eating her breakfast, but appeared she was not interested in eating all of it. Even though she still felt "off," she felt like like going home today.  She also provided additional history, saying she had had a thyroid operation before and that she has had vocal cord problems since.   Objective: Vital signs in last 24 hours: Filed Vitals:   11/06/14 0156 11/06/14 0500 11/06/14 0659 11/06/14 0914  BP: 150/65  144/84 148/61  Pulse: 64  57 73  Temp: 97.8 F (36.6 C)  98.2 F (36.8 C) 98.2 F (36.8 C)  TempSrc: Oral  Oral Oral  Resp: 18  18   Height:      Weight:  143 lb (64.864 kg)    SpO2: 100%  98% 99%   Weight change: 11 lb 14 oz (5.386 kg)  Intake/Output Summary (Last 24 hours) at 11/06/14 0923 Last data filed at 11/06/14 0837  Gross per 24 hour  Intake    240 ml  Output      0 ml  Net    240 ml   Physical Exam  General: Lying in bed, NAD. Eating breakfast. HEENT: Some swelling in right posterior head, without lacerations or bleeding. Pupils were equal round and reactive to light. No nystagmus, extraocular muscles in tact/ She has a longstanding bilateral rash in the superior maxillary area. Cardiovascular: RRR, no murmurs. Normal S1, S2. Pulmonary: Clear to auscultation without wheezes or crackles.  Ext: Arthritic changes noted in digits. Otherwise, no clubbing, cyanosis, or edema Neuro: Oriented to person, place, and time. Cooperative to exam  Lab Results: Basic Metabolic Panel:  Recent Labs Lab 11/05/14 0700 11/06/14 0455  NA 128* 128*  K 3.0* 3.4*  CL 88* 95*  CO2 28 27  GLUCOSE 102* 101*  BUN 12 7  CREATININE 1.01* 0.81  CALCIUM 9.9 8.9   CBC:  Recent Labs Lab 11/05/14 0700 11/06/14 0455  WBC 5.8 4.9  NEUTROABS 3.4  --   HGB 12.6 10.9*  HCT 36.2 32.0*  MCV 89.6 90.7  PLT  233 221   Coagulation:  Recent Labs Lab 11/05/14 0700  LABPROT 12.8  INR 0.95   Micro Results: No results found for this or any previous visit (from the past 240 hour(s)). Studies/Results: Ct Head Wo Contrast  11/05/2014   CLINICAL DATA:  Follow-up subdural hematoma.  EXAM: CT HEAD WITHOUT CONTRAST  TECHNIQUE: Contiguous axial images were obtained from the base of the skull through the vertex without intravenous contrast.  COMPARISON:  Brain CT 11/05/2014  FINDINGS: Ventricles and sulci are prominent compatible with atrophy. Periventricular and subcortical white matter hypodensity compatible with chronic small vessel ischemic changes. No significant interval change in high density extra-axial abnormality overlying left frontal convexity (image 12; series 2) no significant mass effect. No evidence for acute cortically based infarct. Paranasal sinuses are unremarkable. Mastoid air cells are well aerated. Calvarium is intact.  IMPRESSION: No significant interval change in size of 5 mm left frontal subdural hematoma.   Electronically Signed   By: Lovey Newcomer M.D.   On: 11/05/2014 15:50   Ct Head Wo Contrast  11/05/2014   CLINICAL DATA:  Fall from bed hitting the head on hardwood floor. Headache and neck pain after fall. Initial encounter.  EXAM: CT HEAD WITHOUT CONTRAST  CT CERVICAL SPINE WITHOUT CONTRAST  TECHNIQUE:  Multidetector CT imaging of the head and cervical spine was performed following the standard protocol without intravenous contrast. Multiplanar CT image reconstructions of the cervical spine were also generated.  COMPARISON:  04/25/2006 head CT  FINDINGS: CT HEAD FINDINGS  Skull and Sinuses:Negative for fracture or destructive process. The mastoids, middle ears, and imaged paranasal sinuses are clear.  Orbits: No acute abnormality.  Brain: High-density extra-axial abnormality at the left frontal pole measuring 5 mm in thickness. Although not as dense as usually seen with acute clot, this is  most consistent with subdural hemorrhage given clinical circumstances and shape. Mass effect the left frontal lobe is minimal. No subarachnoid hemorrhage. No evidence of acute infarct, hydrocephalus, or shift.  There is cerebral volume loss in keeping with age, progressed since 2008. Mild small vessel disease, pattern similar to 2008, best seen around the lateral ventricles.  Critical Value/emergent results were called by telephone at the time of interpretation on 11/05/2014 at 6:50 am to Dr. Everlene Bright , who verbally acknowledged these results.  CT CERVICAL SPINE FINDINGS  No evidence of acute fracture traumatic malalignment.  Diffuse degenerative disc disease with disc narrowing greatest at C5-6 and C6-7. There is a moderate central disc protrusion with mass effect on the ventral cord.  No evidence of canal hematoma or prevertebral edema.  IMPRESSION: 1. Thin (85mm) left frontal subdural hematoma 2. No acute cervical spine finding. 3. Notable C3-4 disc protrusion with ventral cord mass effect.   Electronically Signed   By: Monte Fantasia M.D.   On: 11/05/2014 06:53   Ct Cervical Spine Wo Contrast  11/05/2014   CLINICAL DATA:  Fall from bed hitting the head on hardwood floor. Headache and neck pain after fall. Initial encounter.  EXAM: CT HEAD WITHOUT CONTRAST  CT CERVICAL SPINE WITHOUT CONTRAST  TECHNIQUE: Multidetector CT imaging of the head and cervical spine was performed following the standard protocol without intravenous contrast. Multiplanar CT image reconstructions of the cervical spine were also generated.  COMPARISON:  04/25/2006 head CT  FINDINGS: CT HEAD FINDINGS  Skull and Sinuses:Negative for fracture or destructive process. The mastoids, middle ears, and imaged paranasal sinuses are clear.  Orbits: No acute abnormality.  Brain: High-density extra-axial abnormality at the left frontal pole measuring 5 mm in thickness. Although not as dense as usually seen with acute clot, this is most consistent  with subdural hemorrhage given clinical circumstances and shape. Mass effect the left frontal lobe is minimal. No subarachnoid hemorrhage. No evidence of acute infarct, hydrocephalus, or shift.  There is cerebral volume loss in keeping with age, progressed since 2008. Mild small vessel disease, pattern similar to 2008, best seen around the lateral ventricles.  Critical Value/emergent results were called by telephone at the time of interpretation on 11/05/2014 at 6:50 am to Dr. Everlene Bright , who verbally acknowledged these results.  CT CERVICAL SPINE FINDINGS  No evidence of acute fracture traumatic malalignment.  Diffuse degenerative disc disease with disc narrowing greatest at C5-6 and C6-7. There is a moderate central disc protrusion with mass effect on the ventral cord.  No evidence of canal hematoma or prevertebral edema.  IMPRESSION: 1. Thin (51mm) left frontal subdural hematoma 2. No acute cervical spine finding. 3. Notable C3-4 disc protrusion with ventral cord mass effect.   Electronically Signed   By: Monte Fantasia M.D.   On: 11/05/2014 06:53   Medications: I have reviewed the patient's current medications. Scheduled Meds: . amLODipine  10 mg Oral Daily  . dorzolamide  1 drop Both Eyes BID  . latanoprost  1 drop Both Eyes QHS  . lisinopril  40 mg Oral Daily  . metoprolol  50 mg Oral BID  . sodium chloride  3 mL Intravenous Q12H   Continuous Infusions:  PRN Meds:.acetaminophen **OR** acetaminophen Assessment/Plan:  Subdural Hematoma: She is neurologically in tact without changes in consciousness or mental status. The left frontal subdural hematoma has remain stable on repeat imaging at 5 mm. Dr. Cyndy Freeze from Neurosurgery recommends followup for repeat imaging. Hgb has also dropped from 12.6 to 10.9 likely due to bleed - Repeat head CT at Dr. Hewitt Shorts office in 4 weeks - Followup Hgb in her clinic on Friday.  Hyponatremia : Differential includes diuretic effect, SIADH, and cerebral salt  wasting. Likely related to to diuretic effect, since she also take potassium pills for longstanding hypokalemia. Specifically, it appears HCTZ may be the most likely culprit since she has felt "off" ever since she started a month ago. Appears euvolemic on exam, which would suggest SIADH due to her head injury, although this takes some time to develop. Sodium has remained stable at 128 today. - BMET this Friday at her clinic - Hold HCTZ, lasix on discharge. May restart furosemide at clinic visit on Friday if leg swell (see below) returns - TSH, morning cortisol pending to evaluate for hypothyroidism and adrenal etiogies  Leg Swelling: Has been a persistent issue for her, although not for this admission and was the reason for starting HCTZ. She had visited the ED for leg swelling in July. - Will hold lasix and HCTZ on discharge and will leave to PCP to restart at Friday clinic visit  Anxiety: Patient takes 5 mg diazepam about every other day. Holding for now.  Hypertension: Holding furosemide and HCTZ in setting of electrolyte abnormalities. - Continue home lisinopril, amlodipine, metoprolol  Chronic headaches:  Holding fioricet for now.  Hyperlipidemia: Continue home pravastatin 40 mg  DVT Prophylaxis: SCDs   Dispo: Anticipated discharge today.  The patient does have a current PCP Luna Fuse, MD) and does not need an [facility] hospital follow-up appointment after discharge.  The patient does not have transportation limitations that hinder transportation to clinic appointments.  .Services Needed at time of discharge: Y = Yes, Blank = No PT:   OT:   RN:   Equipment:   Other:       Liberty Handy, MD 11/06/2014, 9:23 AM

## 2014-11-06 NOTE — Progress Notes (Signed)
Pt is being discharged home. Discharge instructions were given to patient and family 

## 2014-11-06 NOTE — Discharge Instructions (Addendum)
Adrienne Bright, you were seen in hospital for a small brain bleed after your fall called a subdural hematoma. We watched that bleed with CT scans and found that it was not worsening. We also noticed your sodium was low, which is likely because of the hydrochlorothiazide (HCTZ), the furosemide, or both. We have discontinued these two medications for now. We scheduled you a follow up visit with you main doctor, Dr. Nolene Ebbs, this Friday August 26 at 10:30AM. Please note that this is at the [facility] and not the [facility] you normally go to. Please call their office if you need directions. They will make sure your blood counts and sodium are okay.  We've also contacted [facility]. This is Dr. Charolotte Capuchin practice, the brain doctor who saw you in the hospital. He would like a repeat CT scan in 4 weeks to see if the brain bleed is worsening. His office said that they would call you to make the appointment.  If you or your husband notice any new confusion, loss of consciousness, new weakness, changes in sensation, or sudden worsening headache, please seek immediate medical attention.  Subdural Hematoma    Bleeding (hemorrhage) that causes blood to form a clot (hematoma) between the skull and the outermost covering of the brain is known as a subdural hematoma. There are two categories of subdural hematomas:  Acute.  Chronic. Acute subdural hematomas occur quickly after a severe head injury. They are the most common cause of death in sports. Chronic subdural hematomas develop over a period of time, perhaps even weeks, after a traumatic head injury. Chronic subdural hematomas may even occur from minor head injuries.  SYMPTOMS  Recurrent headaches that worsen each day.  Drowsiness or dizziness.  Changes in mental status or confusion.  Weakness or numbness on one side of the body.  Vision problems (including blurred vision).  Vomiting without  nausea.  Eye pupils of different size (sometimes). CAUSES  Acute: a severe blow to the head that bruises and tears the brain and blood vessels.  Chronic: minor, even forgotten, head injury. The blood in the enclosed space in the brain forms a hematoma that slowly increases with further bleeding. RISK INCREASES WITH:  Use of blood thinning drugs, including:  Warfarin.  Aspirin.  Anti-inflammatory medicines. Bleeding disorders, such as:  The body cannot control blood clotting.  The bone marrow not producing enough blood cells. Contact sports (boxing, football, or hockey).  Other sports that increase risk include:  Racing cars.  Motorcycle riding.  Cycling.  Horseback riding. PREVENTION  Avoid situations in which a head injury is likely. Wear properly fitted and padded head gear during:  Contact sports.  While riding a bicycle, motorcycle, or horse. PROGNOSIS  Subdural hematomas are a serious condition and the degree of recovery depends on:  The general health and age of the patient.  Severity of the injury.  Rapidity of treatment.  Extent of the bleeding or clot. If treated properly and rapidly, then the outlook is good.  RELATED COMPLICATIONS  Death or permanent brain damage, including:  Partial or complete paralysis.  Behavioral and personality changes.  Speech problems.  Convulsions. TREATMENT  Subdural hematomas are a medical emergency. They require emergency medical attention. After diagnosis, treatment involves surgery to remove the hematoma. If the injury has caused speech or muscle control damage, therapy may be necessary to regain function. Do not participate in contact sports after you have had a subdural hematoma.  MEDICATION  Steroid medicines and diuretics reduce swelling inside the skull.  Anticonvulsants may be prescribed to reduce the likelihood of convulsions. SEEK MEDICAL CARE IF:  You sustain even a moderate blow to the head.  You develop any symptoms of a  subdural hematoma or hemorrhage. This is an emergency.  The following occur during or after treatment:  Fever.  Red, swollen, or tender surgical wound.  Worsening headache.  Increasing drowsiness or unconsciousness.  Nausea or vomiting.  Increasing confusion or mental changes.

## 2014-11-06 NOTE — Discharge Summary (Signed)
Name: Adrienne Bright MRN: [id number] DOB: 07-Nov-1928 81 y.o. PCP: Luna Fuse, MD  Date of Admission: 11/05/2014  5:05 AM Date of Discharge: 11/06/2014 Attending Physician: Bartholomew Crews, MD  Discharge Diagnosis: Subdural Hematoma Hyponatremia  Discharge Medications:   Medication List    STOP taking these medications        furosemide 20 MG tablet  Commonly known as:  LASIX     hydrochlorothiazide 12.5 MG capsule  Commonly known as:  MICROZIDE     KLOR-CON M20 20 MEQ tablet  Generic drug:  potassium chloride SA      TAKE these medications        acetaminophen 325 MG tablet  Commonly known as:  TYLENOL  Take 2 tablets (650 mg total) by mouth every 6 (six) hours as needed for mild pain (or Fever >/= 101).     amLODipine 10 MG tablet  Commonly known as:  NORVASC  Take 10 mg by mouth daily.     Butalbital-APAP-Caffeine 50-300-40 MG Caps  Take 1 tablet by mouth as needed (migraines).     diazepam 5 MG tablet  Commonly known as:  VALIUM  Take 2.5 mg by mouth 2 (two) times daily as needed for anxiety (anxiety).     dorzolamide 2 % ophthalmic solution  Commonly known as:  TRUSOPT  Place 1 drop into both eyes 2 (two) times daily.     latanoprost 0.005 % ophthalmic solution  Commonly known as:  XALATAN  Place 1 drop into both eyes at bedtime.     lisinopril 40 MG tablet  Commonly known as:  PRINIVIL,ZESTRIL  Take 40 mg by mouth daily.     loratadine 10 MG tablet  Commonly known as:  CLARITIN  Take 10 mg by mouth daily as needed for allergies.     meloxicam 7.5 MG tablet  Commonly known as:  MOBIC  Take 7.5 mg by mouth 2 (two) times daily as needed for pain (inflammation).     metoprolol 50 MG tablet  Commonly known as:  LOPRESSOR  Take 50 mg by mouth 2 (two) times daily.     pravastatin 40 MG tablet  Commonly known as:  PRAVACHOL  Take 40 mg by mouth every other day.        Disposition and follow-up:   AdrienneSekai R Bright was  discharged from [facility] in Good condition.  At the hospital follow up visit please address:  1.  She had a 5 mm right frontal subdural hematoma on non-contract head CT. A repeat scan 8 hours later showed no progression and she remained neurologically in tact. She will be obtaining a repeat head CT at [place] in 4 weeks.  2. She was noted to have hyponatremia to 128 on admission, a change from her baseline of the mid-130s. Our working differential was that it was related to the recent addition of HCTZ. SIADH did not fit since this takes time to develop after a head injury. Furosemide and HCTZ were removed from her medication list on discharge to help correct the hyponatremia, with the understanding that perhaps just the furosemide could be restarted for leg swelling this Friday at her PCP's discretion. We have also not discharged on KLOR-Con due to cessation diuretics, but this may be restarted at the PCP's discretion.  3.  Labs / imaging needed at time of follow-up: She will need a Basic Metabolic Panel to assess improvement in hyponatremia and a CBC to  monitor HgB.  4.  Pending labs/ test needing follow-up: None  Follow-up Appointments:     Follow-up Information    Follow up with Philis Fendt, MD On 11/10/2014.   Specialty:  Internal Medicine   Why:  10:30AM appointment. Will need recheck of CBC, BMET. Follow-up on hyponatremia.   Contact information:   [facility] [address] [phone]       Follow up with Kevan Ny Ditty, MD. Go in 4 weeks.   Specialty:  Neurosurgery   Why:  Repeat non-contrast head CT for subdural hematoma. They will call to make an appointment.   Contact information:   [address] [place] [address] [phone]       Discharge Instructions:  Adrienne Bright, you were seen in hospital for a small brain bleed after your fall called a subdural hematoma. We watched  that bleed with CT scans and found that it was not worsening. We also noticed your sodium was low, which is likely because of the hydrochlorothiazide (HCTZ), the furosemide, or both. We have discontinued these two medications for now. We scheduled you a follow up visit with you main doctor, Dr. Nolene Ebbs, this Friday August 26 at 10:30AM. Please note that this is at the [facility] and not the [facility] you normally go to. Please call their office if you need directions. They will make sure your blood counts and sodium are okay.  We've also contacted [facility]. This is Dr. Charolotte Capuchin practice, the brain doctor who saw you in the hospital. He would like a repeat CT scan in 4 weeks to see if the brain bleed is worsening. His office said that they would call you to make the appointment.  If you or your husband notice any new confusion, loss of consciousness, new weakness, changes in sensation, or sudden worsening headache, please seek immediate medical attention.   Consultations: Dr. Maury Dus, Neurosurgery  Procedures Performed:  Ct Head Wo Contrast  11/05/2014   CLINICAL DATA:  Follow-up subdural hematoma.  EXAM: CT HEAD WITHOUT CONTRAST  TECHNIQUE: Contiguous axial images were obtained from the base of the skull through the vertex without intravenous contrast.  COMPARISON:  Brain CT 11/05/2014  FINDINGS: Ventricles and sulci are prominent compatible with atrophy. Periventricular and subcortical white matter hypodensity compatible with chronic small vessel ischemic changes. No significant interval change in high density extra-axial abnormality overlying left frontal convexity (image 12; series 2) no significant mass effect. No evidence for acute cortically based infarct. Paranasal sinuses are unremarkable. Mastoid air cells are well aerated. Calvarium is intact.  IMPRESSION: No significant interval change in size of 5 mm left frontal  subdural hematoma.   Electronically Signed   By: Lovey Newcomer M.D.   On: 11/05/2014 15:50   Ct Head Wo Contrast  11/05/2014   CLINICAL DATA:  Fall from bed hitting the head on hardwood floor. Headache and neck pain after fall. Initial encounter.  EXAM: CT HEAD WITHOUT CONTRAST  CT CERVICAL SPINE WITHOUT CONTRAST  TECHNIQUE: Multidetector CT imaging of the head and cervical spine was performed following the standard protocol without intravenous contrast. Multiplanar CT image reconstructions of the cervical spine were also generated.  COMPARISON:  04/25/2006 head CT  FINDINGS: CT HEAD FINDINGS  Skull and Sinuses:Negative for fracture or destructive process. The mastoids, middle ears, and imaged paranasal sinuses are clear.  Orbits: No acute abnormality.  Brain: High-density extra-axial abnormality at the left frontal pole  measuring 5 mm in thickness. Although not as dense as usually seen with acute clot, this is most consistent with subdural hemorrhage given clinical circumstances and shape. Mass effect the left frontal lobe is minimal. No subarachnoid hemorrhage. No evidence of acute infarct, hydrocephalus, or shift.  There is cerebral volume loss in keeping with age, progressed since 2008. Mild small vessel disease, pattern similar to 2008, best seen around the lateral ventricles.  Critical Value/emergent results were called by telephone at the time of interpretation on 11/05/2014 at 6:50 am to Dr. Everlene Balls , who verbally acknowledged these results.  CT CERVICAL SPINE FINDINGS  No evidence of acute fracture traumatic malalignment.  Diffuse degenerative disc disease with disc narrowing greatest at C5-6 and C6-7. There is a moderate central disc protrusion with mass effect on the ventral cord.  No evidence of canal hematoma or prevertebral edema.  IMPRESSION: 1. Thin (9mm) left frontal subdural hematoma 2. No acute cervical spine finding. 3. Notable C3-4 disc protrusion with ventral cord mass effect.    Electronically Signed   By: Monte Fantasia M.D.   On: 11/05/2014 06:53   Ct Cervical Spine Wo Contrast  11/05/2014   CLINICAL DATA:  Fall from bed hitting the head on hardwood floor. Headache and neck pain after fall. Initial encounter.  EXAM: CT HEAD WITHOUT CONTRAST  CT CERVICAL SPINE WITHOUT CONTRAST  TECHNIQUE: Multidetector CT imaging of the head and cervical spine was performed following the standard protocol without intravenous contrast. Multiplanar CT image reconstructions of the cervical spine were also generated.  COMPARISON:  04/25/2006 head CT  FINDINGS: CT HEAD FINDINGS  Skull and Sinuses:Negative for fracture or destructive process. The mastoids, middle ears, and imaged paranasal sinuses are clear.  Orbits: No acute abnormality.  Brain: High-density extra-axial abnormality at the left frontal pole measuring 5 mm in thickness. Although not as dense as usually seen with acute clot, this is most consistent with subdural hemorrhage given clinical circumstances and shape. Mass effect the left frontal lobe is minimal. No subarachnoid hemorrhage. No evidence of acute infarct, hydrocephalus, or shift.  There is cerebral volume loss in keeping with age, progressed since 2008. Mild small vessel disease, pattern similar to 2008, best seen around the lateral ventricles.  Critical Value/emergent results were called by telephone at the time of interpretation on 11/05/2014 at 6:50 am to Dr. Everlene Balls , who verbally acknowledged these results.  CT CERVICAL SPINE FINDINGS  No evidence of acute fracture traumatic malalignment.  Diffuse degenerative disc disease with disc narrowing greatest at C5-6 and C6-7. There is a moderate central disc protrusion with mass effect on the ventral cord.  No evidence of canal hematoma or prevertebral edema.  IMPRESSION: 1. Thin (78mm) left frontal subdural hematoma 2. No acute cervical spine finding. 3. Notable C3-4 disc protrusion with ventral cord mass effect.   Electronically  Signed   By: Monte Fantasia M.D.   On: 11/05/2014 06:53    Admission HPI: Mrs. Landgrebe is an 81 year old women with PMH of HTN and chronic headaches who presents after hitting her head after falling out of bed this morning. She was joined by her husband at bedside who helped relay this history. She said she was having a nightmare and the next thing she knew she had fallen out of bed and hit the right side on the back of her head on the wood floor. She was wearing a night cap at the time which provided some minimal cushioning. She said it  did not lead to any bleeding, and she simply took a Fiorcet, for which she takes chronic headaches, and went back to bed. However she woke up and she said that she just didn't "feel right." She reports a moderate posterior headache that has not been worsening. She denies fevers, chills, chest pain, shortness of breath, abdominal pain, nausea, vomiting, loss of bowel or urinary control, new weakness or changes in sensation, confusion, seizure-like activity, and loss of consciousness. The husband confirms that she is acting how show normally does. She is not on any antiplatelet therapy or anticoagulation.She reports a general loss of appetite and "not feeling right" since taking hydrochlorothiazide for her blood pressure. She said that that medication also makes her feel constipated. She also says she has had longstanding headaches for years. She says that come on at night, last about 30 minutes, and are improved after taking Fiorecet. She also takes Valium every other day, which she has been taking for years, for anxiety.In the ED, a CT of the head was obtained which revealed a 5 mm left frontal subdural hematoma. Notably, there was a C3-4 disc protrusion with ventral cord mass effect.Mrs. Coyne is an 81 year old women with PMH of HTN and chronic headaches who presents after hitting her head after falling out of bed this morning. She was joined by her husband at bedside  who helped relay this history. She said she was having a nightmare and the next thing she knew she had fallen out of bed and hit the right side on the back of her head on the wood floor. She was wearing a night cap at the time which provided some minimal cushioning. She said it did not lead to any bleeding, and she simply took a Fiorcet, for which she takes chronic headaches, and went back to bed. However she woke up and she said that she just didn't "feel right." She reports a moderate posterior headache that has not been worsening. She denies fevers, chills, chest pain, shortness of breath, abdominal pain, nausea, vomiting, loss of bowel or urinary control, new weakness or changes in sensation, confusion, seizure-like activity, and loss of consciousness. The husband confirms that she is acting how show normally does. She is not on any antiplatelet therapy or anticoagulation. She reports a general loss of appetite and "not feeling right" since taking hydrochlorothiazide for her blood pressure. She said that that medication also makes her feel constipated. She also says she has had longstanding headaches for years. She says that come on at night, last about 30 minutes, and are improved after taking Fiorecet. She also takes Valium every other day, which she has been taking for years, for anxiety.In the ED, a CT of the head was obtained which revealed a 5 mm left frontal subdural hematoma. Notably, there was a C3-4 disc protrusion with ventral cord mass effect.   Hospital Course by problem list:   Subdural Hematoma:  Repeat head CT showed stable 5 mm bleed without progression.  She remained neurologically in tact during her hospitalization with repeat examinations. She did endorse a headache different from her usual chronic headaches that was relieved with tylenol. She reported that she has had no other falls at home and is able to complete all her ADLs at home. The scan was evaluated by neurosurgeon, Dr.  Marland Kitchen Ditty, who recommended an outpatient CT scan in 4 weeks.  Hyponatremia:  Repeat BMET on 8/22 showed stable NA of 128. The differential on discharge was diuretic effect  or SIADH after head injury, although the latter was considered less likely due to the chronicity. Her home furosemide and thiazide were withheld during hospitalization and on discharge. Due to her past leg swelling, we left it to the discretion of her PCP on whether to restart these at her appointment on August 26. Cortisol and TSH normal as an inpatient.  HTN:  Furosemide and HCTZ were held in setting of hyponatremia. Continued on home lisinopril, metoprolol, and amlodipine.  Chronic Headaches (unspecified): No episodes as an inpatient  Anxiety: Diazepam held as an inpatient. Discharged on home dose.  Hyperlipidemia: Continued home pravastatin   Discharge Vitals:   BP 148/61 mmHg  Pulse 73  Temp(Src) 98.2 F (36.8 C) (Oral)  Resp 18  Ht 5\' 2"  (1.575 m)  Wt 143 lb (64.864 kg)  BMI 26.15 kg/m2  SpO2 99%  Discharge Labs:  Results for orders placed or performed during the hospital encounter of 11/05/14 (from the past 24 hour(s))  CBC     Status: Abnormal   Collection Time: 11/06/14  4:55 AM  Result Value Ref Range   WBC 4.9 4.0 - 10.5 K/uL   RBC 3.53 (L) 3.87 - 5.11 MIL/uL   Hemoglobin 10.9 (L) 12.0 - 15.0 g/dL   HCT 32.0 (L) 36.0 - 46.0 %   MCV 90.7 78.0 - 100.0 fL   MCH 30.9 26.0 - 34.0 pg   MCHC 34.1 30.0 - 36.0 g/dL   RDW 13.3 11.5 - 15.5 %   Platelets 221 150 - 400 K/uL  Basic metabolic panel     Status: Abnormal   Collection Time: 11/06/14  4:55 AM  Result Value Ref Range   Sodium 128 (L) 135 - 145 mmol/L   Potassium 3.4 (L) 3.5 - 5.1 mmol/L   Chloride 95 (L) 101 - 111 mmol/L   CO2 27 22 - 32 mmol/L   Glucose, Bld 101 (H) 65 - 99 mg/dL   BUN 7 6 - 20 mg/dL   Creatinine, Ser 0.81 0.44 - 1.00 mg/dL   Calcium 8.9 8.9 - 10.3 mg/dL   GFR calc non Af Amer >60 >60 mL/min   GFR calc Af Amer >60 >60  mL/min   Anion gap 6 5 - 15  Cortisol     Status: None   Collection Time: 11/06/14  8:17 AM  Result Value Ref Range   Cortisol, Plasma 17.5 ug/dL  TSH     Status: None   Collection Time: 11/06/14  8:17 AM  Result Value Ref Range   TSH 0.909 0.350 - 4.500 uIU/mL    Signed: Liberty Handy, MD 11/06/2014, 10:42 AM

## 2014-11-06 NOTE — Evaluation (Signed)
Occupational Therapy Evaluation and Discharge Patient Details Name: Adrienne Bright MRN: [id number] DOB: [birth year] Today's Date: 11/06/2014    History of Present Illness Fall out of bed with head injury. CT: (21mm) left frontal subdural hematoma   Clinical Impression   This 81 yo female admitted with above presents to acute OT at a S level due to recent fall. No further OT needs, we will sign off.    Follow Up Recommendations  No OT follow up    Equipment Recommendations  None recommended by OT       Precautions / Restrictions Precautions Precautions: Fall Restrictions Weight Bearing Restrictions: No      Mobility Bed Mobility Overal bed mobility: Independent                Transfers Overall transfer level: Needs assistance   Transfers: Sit to/from Stand Sit to Stand: Supervision              Balance Overall balance assessment: Needs assistance Sitting-balance support: Feet supported;No upper extremity supported Sitting balance-Adrienne Bright: Normal     Standing balance support: No upper extremity supported Standing balance-Adrienne Bright: Poor Standing balance comment: Pt can walk and look up/down, right /left with slight off balance x2 which pt self corrected                            ADL                                         General ADL Comments: overall S due to recent fall. Pt states that she normally stands to dress her LB--I recommended to her that she sit to do this from now on     Vision Additional Comments: Wears glasses for far away          Pertinent Vitals/Pain Pain Assessment: No/denies pain     Hand Dominance Right   Extremity/Trunk Assessment Upper Extremity Assessment Upper Extremity Assessment: Overall WFL for tasks assessed   Lower Extremity Assessment Lower Extremity Assessment: Overall WFL for tasks assessed       Communication Communication Communication: No difficulties    Cognition Arousal/Alertness: Awake/alert Behavior During Therapy: WFL for tasks assessed/performed Overall Cognitive Status: Within Functional Limits for tasks assessed                                Home Living Family/patient expects to be discharged to:: Private residence Living Arrangements: Spouse/significant other (however pt also states that she and husband have 2 separate houses) Available Help at Discharge: Family;Available 24 hours/day (husband--both he and pt report this) Type of Home: House Home Access: Level entry     Home Layout: One level     Bathroom Shower/Tub: Occupational psychologist: Standard     Home Equipment: Cane - single point          Prior Functioning/Environment Level of Independence: Independent             OT Diagnosis: Generalized weakness         OT Goals(Current goals can be found in the care plan section) Acute Rehab OT Goals Patient Stated Goal: home today  OT Frequency:                End of Session  Equipment Utilized During Treatment:  (none)  Activity Tolerance: Patient tolerated treatment well Patient left: in chair;with call bell/phone within reach;with family/visitor present   Time: 5945-8592 OT Time Calculation (min): 16 min Charges:  OT General Charges $OT Visit: 1 Procedure OT Evaluation $Initial OT Evaluation Tier I: 1 Procedure G-Codes: OT G-codes **NOT FOR INPATIENT CLASS** Functional Assessment Tool Used: Clinical observation Functional Limitation: Self care Self Care Current Status (T2446): At least 1 percent but less than 20 percent impaired, limited or restricted Self Care Goal Status (K8638): At least 1 percent but less than 20 percent impaired, limited or restricted Self Care Discharge Status [phone]): At least 1 percent but less than 20 percent impaired, limited or restricted  [name] [phone] 11/06/2014, 1:46 PM

## 2014-11-06 NOTE — Progress Notes (Signed)
PT Cancellation Note  Patient Details Name: JENELLA CRAIGIE MRN: [id number] DOB: [birth year]   Cancelled Treatment:    Reason Eval/Treat Not Completed: Medical issues which prohibited therapy Holding PT evaluation as pt with bedrest orders. Will follow up once activity orders are increased.   Marguarite Arbour A Hawken Bielby 11/06/2014, 9:22 AM  Wray Kearns, [place], DPT [phone]

## 2014-11-06 NOTE — Progress Notes (Signed)
  Date: 11/06/2014  Patient name: Adrienne Bright  Medical record number: [id number]  Date of birth: September 27, 1928   This patient has been seen and the plan of care was discussed with the house staff. Please see their note for complete details. I concur with their findings with the following additions/corrections: [name] c/o a HA that is not her usual HA but was helped with tylenol. She has been walking around her room without difficult. She has not fallen (outside of the fall from bed prior to admit) at home and is indep in her ADL's. Due to new onset of hyponatremia, we are W/U although results will not be back until after D/C. She will need close PCP F/U for Na, HgB, SDH, and BP med mgmt. Stable for D/C home today.  Bartholomew Crews, MD 11/06/2014, 10:17 AM

## 2014-12-12 ENCOUNTER — Emergency Department (HOSPITAL_COMMUNITY): Payer: Medicare Other

## 2014-12-12 ENCOUNTER — Inpatient Hospital Stay (HOSPITAL_COMMUNITY)
Admission: EM | Admit: 2014-12-12 | Discharge: 2014-12-13 | DRG: 313 | Disposition: A | Discharge status: Home / Self Care | Payer: Medicare Other | Attending: Internal Medicine | Admitting: Internal Medicine

## 2014-12-12 ENCOUNTER — Encounter (HOSPITAL_COMMUNITY): Payer: Self-pay | Admitting: Emergency Medicine

## 2014-12-12 DIAGNOSIS — E785 Hyperlipidemia, unspecified: Secondary | ICD-10-CM | POA: Diagnosis present

## 2014-12-12 DIAGNOSIS — R079 Chest pain, unspecified: Secondary | ICD-10-CM | POA: Diagnosis not present

## 2014-12-12 DIAGNOSIS — G9389 Other specified disorders of brain: Secondary | ICD-10-CM

## 2014-12-12 DIAGNOSIS — J309 Allergic rhinitis, unspecified: Secondary | ICD-10-CM | POA: Diagnosis present

## 2014-12-12 DIAGNOSIS — M069 Rheumatoid arthritis, unspecified: Secondary | ICD-10-CM | POA: Diagnosis present

## 2014-12-12 DIAGNOSIS — R51 Headache: Secondary | ICD-10-CM | POA: Diagnosis present

## 2014-12-12 DIAGNOSIS — I10 Essential (primary) hypertension: Secondary | ICD-10-CM | POA: Diagnosis not present

## 2014-12-12 DIAGNOSIS — Z888 Allergy status to other drugs, medicaments and biological substances status: Secondary | ICD-10-CM | POA: Diagnosis not present

## 2014-12-12 DIAGNOSIS — Z79899 Other long term (current) drug therapy: Secondary | ICD-10-CM | POA: Diagnosis not present

## 2014-12-12 DIAGNOSIS — E876 Hypokalemia: Secondary | ICD-10-CM | POA: Diagnosis present

## 2014-12-12 DIAGNOSIS — R0789 Other chest pain: Secondary | ICD-10-CM | POA: Diagnosis present

## 2014-12-12 DIAGNOSIS — E871 Hypo-osmolality and hyponatremia: Secondary | ICD-10-CM | POA: Diagnosis present

## 2014-12-12 DIAGNOSIS — R072 Precordial pain: Secondary | ICD-10-CM

## 2014-12-12 DIAGNOSIS — F411 Generalized anxiety disorder: Secondary | ICD-10-CM | POA: Diagnosis present

## 2014-12-12 DIAGNOSIS — D329 Benign neoplasm of meninges, unspecified: Secondary | ICD-10-CM | POA: Diagnosis present

## 2014-12-12 DIAGNOSIS — R519 Headache, unspecified: Secondary | ICD-10-CM | POA: Diagnosis present

## 2014-12-12 HISTORY — DX: Unspecified osteoarthritis, unspecified site: M19.90

## 2014-12-12 LAB — CBC
HCT: 37 % (ref 36.0–46.0)
HEMOGLOBIN: 12.3 g/dL (ref 12.0–15.0)
MCH: 31.1 pg (ref 26.0–34.0)
MCHC: 33.2 g/dL (ref 30.0–36.0)
MCV: 93.4 fL (ref 78.0–100.0)
PLATELETS: 234 10*3/uL (ref 150–400)
RBC: 3.96 MIL/uL (ref 3.87–5.11)
RDW: 14.3 % (ref 11.5–15.5)
WBC: 6 10*3/uL (ref 4.0–10.5)

## 2014-12-12 LAB — I-STAT TROPONIN, ED: Troponin i, poc: 0 ng/mL (ref 0.00–0.08)

## 2014-12-12 LAB — URINE MICROSCOPIC-ADD ON

## 2014-12-12 LAB — CREATININE, URINE, RANDOM: Creatinine, Urine: 75.49 mg/dL

## 2014-12-12 LAB — CBC WITH DIFFERENTIAL/PLATELET
Basophils Absolute: 0 10*3/uL (ref 0.0–0.1)
Basophils Relative: 1 %
Eosinophils Absolute: 0.1 10*3/uL (ref 0.0–0.7)
Eosinophils Relative: 1 %
HEMATOCRIT: 36.9 % (ref 36.0–46.0)
Hemoglobin: 12.5 g/dL (ref 12.0–15.0)
LYMPHS ABS: 1.5 10*3/uL (ref 0.7–4.0)
LYMPHS PCT: 29 %
MCH: 31.9 pg (ref 26.0–34.0)
MCHC: 33.9 g/dL (ref 30.0–36.0)
MCV: 94.1 fL (ref 78.0–100.0)
MONO ABS: 0.5 10*3/uL (ref 0.1–1.0)
MONOS PCT: 10 %
NEUTROS ABS: 3.1 10*3/uL (ref 1.7–7.7)
Neutrophils Relative %: 59 %
Platelets: 222 10*3/uL (ref 150–400)
RBC: 3.92 MIL/uL (ref 3.87–5.11)
RDW: 14.4 % (ref 11.5–15.5)
WBC: 5.2 10*3/uL (ref 4.0–10.5)

## 2014-12-12 LAB — URINALYSIS, ROUTINE W REFLEX MICROSCOPIC
Bilirubin Urine: NEGATIVE
Glucose, UA: NEGATIVE mg/dL
Ketones, ur: NEGATIVE mg/dL
Nitrite: NEGATIVE
PH: 7 (ref 5.0–8.0)
Protein, ur: NEGATIVE mg/dL
SPECIFIC GRAVITY, URINE: 1.005 (ref 1.005–1.030)
UROBILINOGEN UA: 0.2 mg/dL (ref 0.0–1.0)

## 2014-12-12 LAB — COMPREHENSIVE METABOLIC PANEL
ALK PHOS: 57 U/L (ref 38–126)
ALT: 15 U/L (ref 14–54)
ANION GAP: 8 (ref 5–15)
AST: 18 U/L (ref 15–41)
Albumin: 4.3 g/dL (ref 3.5–5.0)
BILIRUBIN TOTAL: 0.4 mg/dL (ref 0.3–1.2)
BUN: 9 mg/dL (ref 6–20)
CALCIUM: 9.8 mg/dL (ref 8.9–10.3)
CO2: 27 mmol/L (ref 22–32)
Chloride: 96 mmol/L — ABNORMAL LOW (ref 101–111)
Creatinine, Ser: 0.92 mg/dL (ref 0.44–1.00)
GFR calc Af Amer: >60 mL/min (ref >60)
GFR, EST NON AFRICAN AMERICAN: 56 mL/min — AB (ref >60)
Glucose, Bld: 111 mg/dL — ABNORMAL HIGH (ref 65–99)
POTASSIUM: 3.2 mmol/L — AB (ref 3.5–5.1)
Sodium: 131 mmol/L — ABNORMAL LOW (ref 135–145)
TOTAL PROTEIN: 6.7 g/dL (ref 6.5–8.1)

## 2014-12-12 LAB — CREATININE, SERUM
Creatinine, Ser: 1.07 mg/dL — ABNORMAL HIGH (ref 0.44–1.00)
GFR calc non Af Amer: 46 mL/min — ABNORMAL LOW (ref >60)
GFR, EST AFRICAN AMERICAN: 54 mL/min — AB (ref >60)

## 2014-12-12 LAB — TROPONIN I
Troponin I: <0.03 ng/mL (ref <0.031)
Troponin I: <0.03 ng/mL (ref <0.031)

## 2014-12-12 LAB — MAGNESIUM: MAGNESIUM: 2.2 mg/dL (ref 1.7–2.4)

## 2014-12-12 LAB — SODIUM, URINE, RANDOM: SODIUM UR: 25 mmol/L

## 2014-12-12 MED ORDER — METOPROLOL TARTRATE 50 MG PO TABS
50.0000 mg | ORAL_TABLET | Freq: Two times a day (BID) | ORAL | Status: DC
  Administered 2014-12-13: 50 mg via ORAL
  Filled 2014-12-12 (×2): qty 1

## 2014-12-12 MED ORDER — AMLODIPINE BESYLATE 10 MG PO TABS
10.0000 mg | ORAL_TABLET | Freq: Every day | ORAL | Status: DC
  Filled 2014-12-12: qty 1

## 2014-12-12 MED ORDER — SODIUM CHLORIDE 0.9 % IV SOLN
INTRAVENOUS | Status: AC
  Administered 2014-12-12: 100 mL via INTRAVENOUS

## 2014-12-12 MED ORDER — ASPIRIN 325 MG PO TABS
325.0000 mg | ORAL_TABLET | Freq: Once | ORAL | Status: AC
  Administered 2014-12-12: 325 mg via ORAL
  Filled 2014-12-12: qty 1

## 2014-12-12 MED ORDER — DIAZEPAM 5 MG PO TABS: 2.5000 mg | ORAL_TABLET | Freq: Two times a day (BID) | ORAL | Status: DC | PRN

## 2014-12-12 MED ORDER — ACETAMINOPHEN 325 MG PO TABS: 650.0000 mg | ORAL_TABLET | Freq: Four times a day (QID) | ORAL | Status: DC | PRN

## 2014-12-12 MED ORDER — DIPHENHYDRAMINE HCL 50 MG/ML IJ SOLN
12.5000 mg | Freq: Once | INTRAMUSCULAR | Status: AC
  Administered 2014-12-12: 12.5 mg via INTRAVENOUS
  Filled 2014-12-12: qty 1

## 2014-12-12 MED ORDER — DORZOLAMIDE HCL 2 % OP SOLN
1.0000 [drp] | Freq: Two times a day (BID) | OPHTHALMIC | Status: DC
  Administered 2014-12-13 (×2): 1 [drp] via OPHTHALMIC
  Filled 2014-12-12: qty 10

## 2014-12-12 MED ORDER — LATANOPROST 0.005 % OP SOLN
1.0000 [drp] | Freq: Every day | OPHTHALMIC | Status: DC
  Administered 2014-12-13: 1 [drp] via OPHTHALMIC
  Filled 2014-12-12: qty 2.5

## 2014-12-12 MED ORDER — POTASSIUM CHLORIDE CRYS ER 20 MEQ PO TBCR: 20.0000 meq | EXTENDED_RELEASE_TABLET | Freq: Every day | ORAL | Status: DC | PRN

## 2014-12-12 MED ORDER — POTASSIUM CHLORIDE CRYS ER 20 MEQ PO TBCR
40.0000 meq | EXTENDED_RELEASE_TABLET | Freq: Once | ORAL | Status: AC
  Administered 2014-12-12: 40 meq via ORAL
  Filled 2014-12-12: qty 2

## 2014-12-12 MED ORDER — BUTALBITAL-APAP-CAFFEINE 50-325-40 MG PO TABS: 1.0000 | ORAL_TABLET | ORAL | Status: DC | PRN

## 2014-12-12 MED ORDER — LISINOPRIL 40 MG PO TABS
40.0000 mg | ORAL_TABLET | Freq: Every day | ORAL | Status: DC
  Filled 2014-12-12: qty 1

## 2014-12-12 MED ORDER — MELOXICAM 7.5 MG PO TABS
7.5000 mg | ORAL_TABLET | Freq: Two times a day (BID) | ORAL | Status: DC | PRN
  Filled 2014-12-12: qty 1

## 2014-12-12 MED ORDER — FUROSEMIDE 20 MG PO TABS: 20.0000 mg | ORAL_TABLET | Freq: Every day | ORAL | Status: DC | PRN

## 2014-12-12 MED ORDER — ENOXAPARIN SODIUM 40 MG/0.4ML ~~LOC~~ SOLN
40.0000 mg | SUBCUTANEOUS | Status: DC
  Filled 2014-12-12: qty 0.4

## 2014-12-12 MED ORDER — FUROSEMIDE 20 MG PO TABS: 20.0000 mg | ORAL_TABLET | Freq: Every day | ORAL | Status: DC

## 2014-12-12 MED ORDER — METOCLOPRAMIDE HCL 5 MG/ML IJ SOLN
10.0000 mg | Freq: Once | INTRAMUSCULAR | Status: AC
  Administered 2014-12-12: 10 mg via INTRAVENOUS
  Filled 2014-12-12: qty 2

## 2014-12-12 MED ORDER — LORATADINE 10 MG PO TABS
10.0000 mg | ORAL_TABLET | Freq: Every day | ORAL | Status: DC | PRN
  Administered 2014-12-13: 10 mg via ORAL

## 2014-12-12 MED ORDER — PRAVASTATIN SODIUM 40 MG PO TABS
40.0000 mg | ORAL_TABLET | ORAL | Status: DC
  Filled 2014-12-12: qty 1

## 2014-12-12 NOTE — H&P (Signed)
Date: 12/12/2014               Patient Name:  Adrienne Bright MRN: [id number]  DOB: [birth year] Age / Sex: 81 y.o., female   PCP: Luna Fuse, MD         Medical Service: Internal Medicine Teaching Service         Attending Physician: Dr. Carlyle Basques, MD    First Contact: Dr. Jule Ser Pager: [phone]  Second Contact: Dr. Dellia Nims Pager: [phone]       After Hours (After 5p/  First Contact Pager: [phone]  weekends / holidays): Second Contact Pager: [phone]   Chief Complaint: chest pain  History of Present Illness: Adrienne Bright is a 81 y.o. AA female with past medical history of HTN, HLD, anxiety, recent diagnosed subdural hematoma.  However, head CT today reports a small hyperdense lesion over the anterior left frontal lobe is likely a meningioma rather than hemorrhage as described on report of the most recent examination.  Today, she presented with left-sided chest pain described as sharp, knife-like, and stabbing.  Chest pain began the day before.  States the pain would occur intermittently and last for 5-10 minutes before subsiding.  States that she cannot associate any aggravating factors such as palpation, inspiration, movement.  States she went about her day as usual, including going for a walk.  This did not alleviate or aggravate the pain.  Overnight, she continued to have this on-and-off chest pain and decided to come to the emergency department for evaluation.  Upon arrival to the emergency department, she also complained of headache.  With her recent history of subdural hematoma, a CT head was ordered with results as described above.  Patient has no significant cardiac history or family history of early onset CAD.  She was followed by Dr. Tami Ribas approximately 6 years ago for HTN management.  She denies any prior history of MI.  Leading up to this most recent admission, patient was hospitalized last month when she fell off the bed and hit her head.  She was  evaluated by neurosurgery at that time with CT head showing a 40mm left frontal subdural hematoma.  Repeat CT of the head showed stable subdural hematoma and, thus, was managed conservatively.    Today in the ED, she had a head CT as described above, EKG showed no significant ST-T wave changes, troponin negative, urinalysis negative for UTI, and chest xray negative.   Meds: Current Facility-Administered Medications  Medication Dose Route Frequency Provider Last Rate Last Dose  . 0.9 %  sodium chloride infusion   Intravenous STAT Wandra Arthurs, MD 100 mL/hr at 12/12/14 1537 100 mL at 12/12/14 1537  . acetaminophen (TYLENOL) tablet 650 mg  650 mg Oral Q6H PRN Dellia Nims, MD      . Derrill Memo ON 12/13/2014] amLODipine (NORVASC) tablet 10 mg  10 mg Oral Daily Tasrif Ahmed, MD      . butalbital-acetaminophen-caffeine (FIORICET, ESGIC) 50-325-40 MG per tablet 1 tablet  1 tablet Oral Q4H PRN Tasrif Ahmed, MD      . diazepam (VALIUM) tablet 2.5 mg  2.5 mg Oral BID PRN Tasrif Ahmed, MD      . dorzolamide (TRUSOPT) 2 % ophthalmic solution 1 drop  1 drop Both Eyes BID Tasrif Ahmed, MD      . enoxaparin (LOVENOX) injection 40 mg  40 mg Subcutaneous Q24H Tasrif Ahmed, MD   40 mg at 12/12/14 1700  . latanoprost (XALATAN) 0.005 %  ophthalmic solution 1 drop  1 drop Both Eyes QHS Tasrif Ahmed, MD      . Derrill Memo ON 12/13/2014] lisinopril (PRINIVIL,ZESTRIL) tablet 40 mg  40 mg Oral Daily Tasrif Ahmed, MD      . loratadine (CLARITIN) tablet 10 mg  10 mg Oral Daily PRN Tasrif Ahmed, MD      . metoprolol (LOPRESSOR) tablet 50 mg  50 mg Oral BID Tasrif Ahmed, MD      . potassium chloride SA (K-DUR,KLOR-CON) CR tablet 20 mEq  20 mEq Oral Daily PRN Tasrif Ahmed, MD      . Derrill Memo ON 12/13/2014] pravastatin (PRAVACHOL) tablet 40 mg  40 mg Oral QODAY Tasrif Ahmed, MD        Allergies: Allergies as of 12/12/2014 - Review Complete 12/12/2014  Allergen Reaction Noted  . Propranolol hcl Anxiety and Palpitations   . Benazepril hcl  Nausea Only and Other (See Comments)   . Amlodipine besylate Other (See Comments)   . Trandolapril Cough    Past Medical History  Diagnosis Date  . Hypertension   . Hypercalcemia   . Headache   . Hypokalemia   . Arthritis     ra   Past Surgical History  Procedure Laterality Date  . Vocal cord nodule    . Cataract extraction    . Nasal sinus surgery    . Tubal ligation     Family History  Problem Relation Age of Onset  . CAD Neg Hx    Social History   Social History  . Marital Status: Married    Spouse Name: N/A  . Number of Children: N/A  . Years of Education: N/A   Occupational History  . Not on file.   Social History Main Topics  . Smoking status: Never Smoker   . Smokeless tobacco: Current User    Types: Snuff  . Alcohol Use: No  . Drug Use: No  . Sexual Activity: Not on file   Other Topics Concern  . Not on file   Social History Narrative    Review of Systems: General: Denies fever, chills, weight loss Respiratory: Denies SOB, DOE, cough, chest tightness, and wheezing.   Cardiovascular: Reports intermittent left-sided chest pain, denies palpitations.  Gastrointestinal: Denies nausea, vomiting, abdominal pain, diarrhea, constipation,  Genitourinary: Denies dysuria, urgency, frequency, hematuria, and flank pain. Endocrine: Denies hot or cold intolerance, polyuria, and polydipsia. Musculoskeletal: Denies myalgias, back pain, joint swelling, arthralgias and gait problem.  Skin: Denies pallor, rash and wounds.  Neurological: Denies dizziness, seizures, syncope, weakness, lightheadedness, numbness and headaches.  Psychiatric/Behavioral: Denies mood changes, confusion, nervousness, sleep disturbance and agitation.  Physical Exam: Blood pressure 150/58, pulse 58, temperature 98.5 F (36.9 C), temperature source Oral, resp. rate 18, SpO2 99 %. General: resting in bed, pleasant, no distress HEENT: EOMI, no scleral icterus Cardiac: RRR, no rubs, murmurs or  gallops Pulm: clear to auscultation bilaterally, moving normal volumes of air Abd: soft, nontender, nondistended, BS present Ext: warm and well perfused, no pedal edema Neuro: alert and oriented X3, cranial nerves II-XII grossly intact Psych: normal mood and affect   Lab results: Basic Metabolic Panel:  Recent Labs  12/12/14 1021 12/12/14 1602  NA 131*  --   K 3.2*  --   CL 96*  --   CO2 27  --   GLUCOSE 111*  --   BUN 9  --   CREATININE 0.92 1.07*  CALCIUM 9.8  --   MG  --  2.2  Liver Function Tests:  Recent Labs  12/12/14 1021  AST 18  ALT 15  ALKPHOS 57  BILITOT 0.4  PROT 6.7  ALBUMIN 4.3   No results for input(s): LIPASE, AMYLASE in the last 72 hours. No results for input(s): AMMONIA in the last 72 hours. CBC:  Recent Labs  12/12/14 1021 12/12/14 1602  WBC 5.2 6.0  NEUTROABS 3.1  --   HGB 12.5 12.3  HCT 36.9 37.0  MCV 94.1 93.4  PLT 222 234   Cardiac Enzymes:  Recent Labs  12/12/14 1602  TROPONINI <0.03   BNP: No results for input(s): PROBNP in the last 72 hours. D-Dimer: No results for input(s): DDIMER in the last 72 hours. CBG: No results for input(s): GLUCAP in the last 72 hours. Hemoglobin A1C: No results for input(s): HGBA1C in the last 72 hours. Fasting Lipid Panel: No results for input(s): CHOL, HDL, LDLCALC, TRIG, CHOLHDL, LDLDIRECT in the last 72 hours. Thyroid Function Tests: No results for input(s): TSH, T4TOTAL, FREET4, T3FREE, THYROIDAB in the last 72 hours. Anemia Panel: No results for input(s): VITAMINB12, FOLATE, FERRITIN, TIBC, IRON, RETICCTPCT in the last 72 hours. Coagulation: No results for input(s): LABPROT, INR in the last 72 hours. Urine Drug Screen: Drugs of Abuse  No results found for: LABOPIA, COCAINSCRNUR, LABBENZ, AMPHETMU, THCU, LABBARB  Alcohol Level: No results for input(s): ETH in the last 72 hours. Urinalysis:  Recent Labs  12/12/14 1155  COLORURINE YELLOW  LABSPEC 1.005  PHURINE 7.0    GLUCOSEU NEGATIVE  HGBUR TRACE*  BILIRUBINUR NEGATIVE  KETONESUR NEGATIVE  PROTEINUR NEGATIVE  UROBILINOGEN 0.2  NITRITE NEGATIVE  LEUKOCYTESUR TRACE*    Imaging results:  Dg Chest 2 View  12/12/2014   CLINICAL DATA:  LEFT-sided chest pain and headache for 2 days. Initial encounter.  EXAM: CHEST  2 VIEW  COMPARISON:  10/05/2014.  FINDINGS: Cardiopericardial silhouette within normal limits. Mediastinal contours normal. Trachea midline. No airspace disease or effusion. Aortic arch atherosclerosis. Monitoring leads project over the abdomen.  IMPRESSION: No active cardiopulmonary disease.   Electronically Signed   By: Dereck Ligas M.D.   On: 12/12/2014 10:52   Ct Head Wo Contrast  12/12/2014   CLINICAL DATA:  Headache and dizziness for 2 days.  EXAM: CT HEAD WITHOUT CONTRAST  TECHNIQUE: Contiguous axial images were obtained from the base of the skull through the vertex without intravenous contrast.  COMPARISON:  COMPARISON PET-CT scan on 11/05/2014 and 04/25/2006.  FINDINGS: Small hyperdense lesion along the falx extending anterior to the left frontal lobe is again seen and unchanged in appearance. Note is made that the lesion tapers as it extends laterally rather than layering along the convexities. Given stability and configuration, it likely represents a meningioma rather than hemorrhage as described on the prior exam. It is new since the 04/25/2006 examination.  Mild atrophy and chronic microvascular ischemic change are again seen. No hemorrhage, hydrocephalus, infarct, midline shift or abnormal extra-axial fluid collection is identified. No hydrocephalus or pneumocephalus. The calvarium is intact. Imaged paranasal sinuses and mastoid air cells are clear.  IMPRESSION: No acute intracranial abnormality.  Small hyperdense lesion over the anterior left frontal lobe is likely a meningioma rather than hemorrhage as described on report of the most recent examination.  Atrophy and chronic microvascular  ischemic change.   Electronically Signed   By: Inge Rise M.D.   On: 12/12/2014 11:43    Other results: EKG: Normal sinus rhythm without significant ST-T wave changes  Assessment & Plan by  Problem: Active Problems:   Anxiety state   Essential hypertension   Headache   Chest pain  81 y.o. AA female with past medical history of HTN, HLD, anxiety, recent diagnosed subdural hematoma.  Chest Pain: non-exertional, atypical features, EKG and initial troponins not elevated. -Consult to Cardiology and appreciate their input -Continue to trend troponins -Plan for [facility] tomorrow to further evaluate -Aspirin 325mg  once  Recent subdural hematoma -Conservative management per neurosurgery from August admission.  CT scan today with possible meningioma instead of hemorrhage.  Patient states she has scheduled outpatient follow up for this later this month.  HTN: home meds are Norvasc 10mg  daily, Lisinopril 40mg  daily, Lopressor 50mg  daily, and Lasix 20mg  daily -HOLD Lasix -Continue other home meds  HLD: home meds are Pravastatin 40mg  daily -continue home meds  Hypokalemia: 3.2 on admission.  Magnesium 2.2 -K-dur 83mEq x 1 -Continue K-dur 32mEq daily   Hyponatremia: potentially due to diuretic effect, SIADH, salt wasting.  Likely diuretic effect given her taking potassium pills for longstanding hypokalemia.  Patient does appear euvolemic on exam, no increased urine frequency -HOLD Lasix -morning BMP -check urine osmolality, urine sodium -TSH and cortisol from August admission normal  Chronic headaches -Continue Fioricet prn  Anxiety -Continue Valium 2.5mg  BID prn  Allergies -Continue Claritin 10mg  prn  Diet: Heart healthy, NPO at [facility] for Myoview tomorrow  DVT PPx: Lovenox  Code: FULL    Dispo: Disposition is deferred at this time, awaiting improvement of current medical problems.   The patient does have a current PCP Luna Fuse, MD) and does not need an  [facility] hospital follow-up appointment after discharge.  The patient does not have transportation limitations that hinder transportation to clinic appointments.  Signed: Jule Ser, DO 12/12/2014, 5:26 PM

## 2014-12-12 NOTE — ED Notes (Signed)
MD at bedside. 

## 2014-12-12 NOTE — ED Notes (Signed)
Pt sts left sided CP and HA x 2 days with some dizziness; pt sts some SOB

## 2014-12-12 NOTE — ED Provider Notes (Signed)
CSN: [id number]     Arrival date & time 12/12/14  3149 History   First MD Initiated Contact with Patient 12/12/14 1003     Chief Complaint  Patient presents with  . Chest Pain  . Headache     (Consider location/radiation/quality/duration/timing/severity/associated sxs/prior Treatment) The history is provided by the patient.  Adrienne Bright is a 81 y.o. female hx of HTN, hyponatremia here presenting with chest pain, headaches. Left-sided chest pain since yesterday. It is intermittent and is not worse with exertion and not pleuritic. Patient states that sometimes the pain does radiate down the left arm. Denies any shortness of breath to me. Patient also has been having intermittent headaches for many months. She was diagnosed with subdural hematoma about a month ago. Denies any head injury since then. Denies any fever or vomiting. Patient has no known CAD.     Past Medical History  Diagnosis Date  . Hypertension   . Hypercalcemia   . Headache   . Hypokalemia    History reviewed. No pertinent past surgical history. History reviewed. No pertinent family history. Social History  Substance Use Topics  . Smoking status: Never Smoker   . Smokeless tobacco: Current User    Types: Snuff  . Alcohol Use: No   OB History    No data available     Review of Systems  Cardiovascular: Positive for chest pain.  Neurological: Positive for headaches.  All other systems reviewed and are negative.     Allergies  Propranolol hcl; Benazepril hcl; Amlodipine besylate; and Trandolapril  Home Medications   Prior to Admission medications   Medication Sig Start Date End Date Taking? Authorizing Provider  acetaminophen (TYLENOL) 325 MG tablet Take 2 tablets (650 mg total) by mouth every 6 (six) hours as needed for mild pain (or Fever >/= 101). 11/06/14  Yes Liberty Handy, MD  amLODipine (NORVASC) 10 MG tablet Take 10 mg by mouth daily.   Yes Historical Provider, MD  diazepam (VALIUM) 5 MG  tablet Take 2.5 mg by mouth 2 (two) times daily as needed for anxiety (anxiety).    Yes Historical Provider, MD  dorzolamide (TRUSOPT) 2 % ophthalmic solution Place 1 drop into both eyes 2 (two) times daily. 05/19/14  Yes Historical Provider, MD  furosemide (LASIX) 20 MG tablet Take 20 mg by mouth daily as needed for fluid.  11/14/14  Yes Historical Provider, MD  KLOR-CON M20 20 MEQ tablet Take 20 mEq by mouth daily as needed (when taking furosemide).  10/14/14  Yes Historical Provider, MD  latanoprost (XALATAN) 0.005 % ophthalmic solution Place 1 drop into both eyes at bedtime. 05/19/14  Yes Historical Provider, MD  lisinopril (PRINIVIL,ZESTRIL) 40 MG tablet Take 40 mg by mouth daily. 05/05/14  Yes Historical Provider, MD  loratadine (CLARITIN) 10 MG tablet Take 10 mg by mouth daily as needed for allergies.   Yes Historical Provider, MD  meloxicam (MOBIC) 7.5 MG tablet Take 7.5 mg by mouth 2 (two) times daily as needed for pain (inflammation).  08/22/14  Yes Historical Provider, MD  metoprolol (LOPRESSOR) 50 MG tablet Take 50 mg by mouth 2 (two) times daily.   Yes Historical Provider, MD  pravastatin (PRAVACHOL) 40 MG tablet Take 40 mg by mouth every other day.    Yes Historical Provider, MD  Butalbital-APAP-Caffeine 50-300-40 MG CAPS Take 1 tablet by mouth as needed (migraines).  05/19/14   Historical Provider, MD   BP 153/71 mmHg  Pulse 65  Temp(Src) 98 F (36.7 C) (  Oral)  Resp 18  SpO2 100% Physical Exam  Constitutional: She is oriented to person, place, and time. She appears well-developed.  Chronically ill, NAD   HENT:  Head: Normocephalic.  Mouth/Throat: Oropharynx is clear and moist.  Eyes: Conjunctivae are normal. Pupils are equal, round, and reactive to light.  Neck: Normal range of motion. Neck supple.  Cardiovascular: Normal rate, regular rhythm and normal heart sounds.   Pulmonary/Chest: Effort normal and breath sounds normal. No respiratory distress. She has no wheezes. She has no  rales.  Abdominal: Soft. Bowel sounds are normal. She exhibits no distension. There is no tenderness. There is no rebound and no guarding.  Musculoskeletal: Normal range of motion. She exhibits no edema or tenderness.  Neurological: She is alert and oriented to person, place, and time. No cranial nerve deficit. Coordination normal.  Skin: Skin is warm and dry.  Psychiatric: She has a normal mood and affect. Her behavior is normal. Thought content normal.  Nursing note and vitals reviewed.   ED Course  Procedures (including critical care time) Labs Review Labs Reviewed  COMPREHENSIVE METABOLIC PANEL - Abnormal; Notable for the following:    Sodium 131 (*)    Potassium 3.2 (*)    Chloride 96 (*)    Glucose, Bld 111 (*)    GFR calc non Af Amer 56 (*)    All other components within normal limits  CBC WITH DIFFERENTIAL/PLATELET  URINALYSIS, ROUTINE W REFLEX MICROSCOPIC (NOT AT [facility])  Randolm Idol, ED    Imaging Review Dg Chest 2 View  12/12/2014   CLINICAL DATA:  LEFT-sided chest pain and headache for 2 days. Initial encounter.  EXAM: CHEST  2 VIEW  COMPARISON:  10/05/2014.  FINDINGS: Cardiopericardial silhouette within normal limits. Mediastinal contours normal. Trachea midline. No airspace disease or effusion. Aortic arch atherosclerosis. Monitoring leads project over the abdomen.  IMPRESSION: No active cardiopulmonary disease.   Electronically Signed   By: Dereck Ligas M.D.   On: 12/12/2014 10:52   Ct Head Wo Contrast  12/12/2014   CLINICAL DATA:  Headache and dizziness for 2 days.  EXAM: CT HEAD WITHOUT CONTRAST  TECHNIQUE: Contiguous axial images were obtained from the base of the skull through the vertex without intravenous contrast.  COMPARISON:  COMPARISON PET-CT scan on 11/05/2014 and 04/25/2006.  FINDINGS: Small hyperdense lesion along the falx extending anterior to the left frontal lobe is again seen and unchanged in appearance. Note is made that the lesion tapers as it  extends laterally rather than layering along the convexities. Given stability and configuration, it likely represents a meningioma rather than hemorrhage as described on the prior exam. It is new since the 04/25/2006 examination.  Mild atrophy and chronic microvascular ischemic change are again seen. No hemorrhage, hydrocephalus, infarct, midline shift or abnormal extra-axial fluid collection is identified. No hydrocephalus or pneumocephalus. The calvarium is intact. Imaged paranasal sinuses and mastoid air cells are clear.  IMPRESSION: No acute intracranial abnormality.  Small hyperdense lesion over the anterior left frontal lobe is likely a meningioma rather than hemorrhage as described on report of the most recent examination.  Atrophy and chronic microvascular ischemic change.   Electronically Signed   By: Inge Rise M.D.   On: 12/12/2014 11:43   I have personally reviewed and evaluated these images and lab results as part of my medical decision-making.   EKG Interpretation   Date/Time:  Tuesday December 12 2014 09:52:40 EDT Ventricular Rate:  60 PR Interval:  156 QRS Duration: 82  QT Interval:  402 QTC Calculation: 402 R Axis:   39 Text Interpretation:  Sinus rhythm with Premature atrial complexes  Otherwise normal ECG No significant change since last tracing Confirmed by  Mckenleigh Tarlton  MD, Shontez Sermon ([id number]) on 12/12/2014 10:02:29 AM      MDM   Final diagnoses:  None    Verlee R Ocanas is a 81 y.o. female here with chest pain, headache. Chest pain since yesterday, concerned for ACS. Headaches chronic, but has hx of subdural so will get CT head and give migraine cocktail. Will likely admit for ACS rule out.   12:23 PM Trop neg x 1. CT head showed possible mass instead of bleed. Headache improved with migraine cocktail. Will admit to ACS workup, further workup of the mass.     Wandra Arthurs, MD 12/12/14 1224

## 2014-12-12 NOTE — Progress Notes (Signed)
Patient admitted to 3E20 via stretcher accompanied by ED tech. Dr. Baxter Flattery paged to notify of patient's arrival. Patient alert and oriented and able to ambulate to bed in room without any assistance. Patient denies pain or discomfort at this time. Patient oriented to room and unit. Side rails up x2, bed low and locked, call light within reach. Vitals obtained, patient placed on telemetry. Will review orders. Will continue to monitor.  Roselyn Reef [place],RN

## 2014-12-12 NOTE — Consult Note (Signed)
CARDIOLOGY CONSULT NOTE   Patient ID: Adrienne Bright MRN: [id number], DOB/AGE: 1928-07-15   Admit date: 12/12/2014 Date of Consult: 12/12/2014   Primary Physician: Luna Fuse, MD Primary Cardiologist: new   Pt. Profile  pleasant 81 year old Caucasian female with past medical history of hypertension and recently diagnosed subdural hematoma secondary to trauma presented with headache and left-sided intermittent chest pain.  Problem List  Past Medical History  Diagnosis Date  . Hypertension   . Hypercalcemia   . Headache   . Hypokalemia   . Arthritis     ra    Past Surgical History  Procedure Laterality Date  . Vocal cord nodule    . Cataract extraction    . Nasal sinus surgery    . Tubal ligation       Allergies  Allergies  Allergen Reactions  . Propranolol Hcl Anxiety and Palpitations  . Benazepril Hcl Nausea Only and Other (See Comments)    REACTION: also dizziness  . Amlodipine Besylate Other (See Comments)    REACTION: questionable intolerance  . Trandolapril Cough    HPI   The patient is a pleasant 81 year old Caucasian female with past medical history of hypertension and recently diagnosed subdural hematoma secondary to trauma. She has no prior cardiac history and no family history of early coronary disease. She was seen by Dr. Tami Ribas about 6 years ago for management of hypertension, however she denies any prior history of MI. She was in her usual status of health until a month ago when she rolled off the bed and hit her head on the floor. She was seen here by neurosurgery. CT of the time showed a 5 mm left frontal subdural hematoma. Repeat CT of the head shows stable subdural hematoma, she was managed conservatively.  She presented to [facility] on 12/12/2014 with chest pain and headache. According to the patient, the chest discomfort occurred in the afternoon of 9/26 while she was at rest. She described as left-sided sharp chest pain radiating  to the neck. She denies any exacerbating factors including deep inspiration or body rotation. She states she tried to walk the pain off, however did not notice any exacerbation or alleviation. Walking basically did not do anything for the chest pain. The chest discomfort occurred on and off every 15 minutes overnight prompting the patient to seek medical attention. On arrival, she also complained of headache. Given her recent history of subdural hematoma, a CT of the head was obtained which showed small hyperdense lesion in the anterior left frontal lobe likely hemangioma rather than hemorrhage. EKG showed no significant ST-T wave changes. Troponin is negative. Urinalysis was negative for UTI. Chest x-ray is negative.   Inpatient Medications  . sodium chloride   Intravenous STAT  . [START ON 12/13/2014] amLODipine  10 mg Oral Daily  . aspirin  325 mg Oral Once  . dorzolamide  1 drop Both Eyes BID  . enoxaparin (LOVENOX) injection  40 mg Subcutaneous Q24H  . latanoprost  1 drop Both Eyes QHS  . [START ON 12/13/2014] lisinopril  40 mg Oral Daily  . metoprolol  50 mg Oral BID  . [START ON 12/13/2014] pravastatin  40 mg Oral QODAY    Family History Family History  Problem Relation Age of Onset  . CAD Neg Hx      Social History Social History   Social History  . Marital Status: Married    Spouse Name: N/A  . Number of Children: N/A  . Years  of Education: N/A   Occupational History  . Not on file.   Social History Main Topics  . Smoking status: Never Smoker   . Smokeless tobacco: Current User    Types: Snuff  . Alcohol Use: No  . Drug Use: No  . Sexual Activity: Not on file   Other Topics Concern  . Not on file   Social History Narrative     Review of Systems  General:  No chills, fever, night sweats or weight changes.  Cardiovascular:  No dyspnea on exertion, edema, orthopnea, palpitations, paroxysmal nocturnal dyspnea. + Intermittent left-sided chest pain,  headache Dermatological: No rash, lesions/masses Respiratory: No cough, dyspnea Urologic: No hematuria, dysuria Abdominal:   No nausea, vomiting, diarrhea, bright red blood per rectum, melena, or hematemesis Neurologic:  No visual changes, wkns, changes in mental status. All other systems reviewed and are otherwise negative except as noted above.  Physical Exam  Blood pressure 150/58, pulse 58, temperature 98.5 F (36.9 C), temperature source Oral, resp. rate 18, SpO2 99 %.  General: Pleasant, NAD Psych: Normal affect. Neuro: Alert and oriented X 3. Moves all extremities spontaneously. HEENT: Normal  Neck: Supple without bruits or JVD. Lungs:  Resp regular and unlabored, CTA. Heart: RRR no s3, s4, or murmurs. Abdomen: Soft, non-tender, non-distended, BS + x 4.  Extremities: No clubbing, cyanosis or edema. DP/PT/Radials 2+ and equal bilaterally.  Labs  No results for input(s): CKTOTAL, CKMB, TROPONINI in the last 72 hours. Lab Results  Component Value Date   WBC 6.0 12/12/2014   HGB 12.3 12/12/2014   HCT 37.0 12/12/2014   MCV 93.4 12/12/2014   PLT 234 12/12/2014    Recent Labs Lab 12/12/14 1021  NA 131*  K 3.2*  CL 96*  CO2 27  BUN 9  CREATININE 0.92  CALCIUM 9.8  PROT 6.7  BILITOT 0.4  ALKPHOS 57  ALT 15  AST 18  GLUCOSE 111*   Lab Results  Component Value Date   CHOL 248* 01/16/2009   HDL 88 01/16/2009   LDLCALC 138* 01/16/2009   TRIG 110 01/16/2009   No results found for: DDIMER  Radiology/Studies  Dg Chest 2 View  12/12/2014   CLINICAL DATA:  LEFT-sided chest pain and headache for 2 days. Initial encounter.  EXAM: CHEST  2 VIEW  COMPARISON:  10/05/2014.  FINDINGS: Cardiopericardial silhouette within normal limits. Mediastinal contours normal. Trachea midline. No airspace disease or effusion. Aortic arch atherosclerosis. Monitoring leads project over the abdomen.  IMPRESSION: No active cardiopulmonary disease.   Electronically Signed   By: Dereck Ligas M.D.   On: 12/12/2014 10:52   Ct Head Wo Contrast  12/12/2014   CLINICAL DATA:  Headache and dizziness for 2 days.  EXAM: CT HEAD WITHOUT CONTRAST  TECHNIQUE: Contiguous axial images were obtained from the base of the skull through the vertex without intravenous contrast.  COMPARISON:  COMPARISON PET-CT scan on 11/05/2014 and 04/25/2006.  FINDINGS: Small hyperdense lesion along the falx extending anterior to the left frontal lobe is again seen and unchanged in appearance. Note is made that the lesion tapers as it extends laterally rather than layering along the convexities. Given stability and configuration, it likely represents a meningioma rather than hemorrhage as described on the prior exam. It is new since the 04/25/2006 examination.  Mild atrophy and chronic microvascular ischemic change are again seen. No hemorrhage, hydrocephalus, infarct, midline shift or abnormal extra-axial fluid collection is identified. No hydrocephalus or pneumocephalus. The calvarium is intact. Imaged  paranasal sinuses and mastoid air cells are clear.  IMPRESSION: No acute intracranial abnormality.  Small hyperdense lesion over the anterior left frontal lobe is likely a meningioma rather than hemorrhage as described on report of the most recent examination.  Atrophy and chronic microvascular ischemic change.   Electronically Signed   By: Inge Rise M.D.   On: 12/12/2014 11:43    ECG  Normal sinus rhythm without significant ST-T wave changes.  ASSESSMENT AND PLAN  1. Chest pain: some atypical features, not exertional  - continue trend trop  - plan for [facility] [place] tomorrow a.m.  2. Recent subdural hematoma  - abnormal CT of brain ?hemangioma  3. hypertension    Signed, Almyra Deforest, PA-C 12/12/2014, 4:47 PM  Patient seen and examined with Almyra Deforest, PA-C. We discussed all aspects of the encounter. I agree with the assessment and plan as stated above.  CP with typical and atypical features. Trop  and ECG are ok. Given CRFs will proceed with Leane Call to further evaluate. Would cath only if very high risk stress test due to recent traumatic SDH.   Yigit Norkus,MD 5:34 PM

## 2014-12-13 ENCOUNTER — Other Ambulatory Visit: Payer: Self-pay | Admitting: Student

## 2014-12-13 DIAGNOSIS — R079 Chest pain, unspecified: Secondary | ICD-10-CM

## 2014-12-13 DIAGNOSIS — I10 Essential (primary) hypertension: Secondary | ICD-10-CM

## 2014-12-13 LAB — BASIC METABOLIC PANEL
Anion gap: 8 (ref 5–15)
BUN: 10 mg/dL (ref 6–20)
CHLORIDE: 104 mmol/L (ref 101–111)
CO2: 25 mmol/L (ref 22–32)
CREATININE: 0.92 mg/dL (ref 0.44–1.00)
Calcium: 9.1 mg/dL (ref 8.9–10.3)
GFR calc Af Amer: >60 mL/min (ref >60)
GFR calc non Af Amer: 56 mL/min — ABNORMAL LOW (ref >60)
Glucose, Bld: 104 mg/dL — ABNORMAL HIGH (ref 65–99)
POTASSIUM: 3.4 mmol/L — AB (ref 3.5–5.1)
Sodium: 137 mmol/L (ref 135–145)

## 2014-12-13 LAB — CBC WITH DIFFERENTIAL/PLATELET
Basophils Absolute: 0 10*3/uL (ref 0.0–0.1)
Basophils Relative: 1 %
EOS ABS: 0.1 10*3/uL (ref 0.0–0.7)
EOS PCT: 2 %
HCT: 35.3 % — ABNORMAL LOW (ref 36.0–46.0)
HEMOGLOBIN: 11.3 g/dL — AB (ref 12.0–15.0)
LYMPHS ABS: 1.6 10*3/uL (ref 0.7–4.0)
Lymphocytes Relative: 27 %
MCH: 30.3 pg (ref 26.0–34.0)
MCHC: 32 g/dL (ref 30.0–36.0)
MCV: 94.6 fL (ref 78.0–100.0)
Monocytes Absolute: 0.4 10*3/uL (ref 0.1–1.0)
Monocytes Relative: 7 %
NEUTROS PCT: 63 %
Neutro Abs: 3.7 10*3/uL (ref 1.7–7.7)
Platelets: 211 10*3/uL (ref 150–400)
RBC: 3.73 MIL/uL — ABNORMAL LOW (ref 3.87–5.11)
RDW: 14.6 % (ref 11.5–15.5)
WBC: 5.8 10*3/uL (ref 4.0–10.5)

## 2014-12-13 LAB — OSMOLALITY, URINE: Osmolality, Ur: 293 mOsm/kg — ABNORMAL LOW (ref 390–1090)

## 2014-12-13 LAB — UREA NITROGEN, URINE: Urea Nitrogen, Ur: 340 mg/dL

## 2014-12-13 NOTE — Progress Notes (Signed)
Discharge instructions given. Pt verbalized understanding and all questions were answered.  

## 2014-12-13 NOTE — Progress Notes (Addendum)
Patient Name: Adrienne Bright Date of Encounter: 12/13/2014  Principal Problem:   Chest pain with moderate risk for cardiac etiology Active Problems:   Anxiety state   Essential hypertension   Headache    Primary Cardiologist: New - Dr. Oval Linsey Patient Profile: 81 yo female w/ PMH of HTN and subdural hematoma (2ry to trauma) who presented to [facility] ED on 12/12/2014 for chest pain and headache.   SUBJECTIVE: Denies any recurrence in chest pain. Denies palpitations or shortness of breath. Was scheduled for Lexiscan this morning but refused the test since her chest pain had resolved and she did not "want to take any more medications".   OBJECTIVE Filed Vitals:   12/13/14 0142 12/13/14 0430 12/13/14 0851 12/13/14 1117  BP: 153/58 153/68 163/49 157/81  Pulse: 58 67 59 79  Temp: 98 F (36.7 C) 98 F (36.7 C) 97.8 F (36.6 C) 97.8 F (36.6 C)  TempSrc: Oral Oral Oral Oral  Resp: 18 18 16 16   Weight:  130 lb 11.2 oz (59.285 kg)    SpO2: 97% 97% 96% 98%    Intake/Output Summary (Last 24 hours) at 12/13/14 1326 Last data filed at 12/12/14 2330  Gross per 24 hour  Intake 478.33 ml  Output    500 ml  Net -21.67 ml   Filed Weights   12/13/14 0430  Weight: 130 lb 11.2 oz (59.285 kg)    PHYSICAL EXAM General: Well developed, well nourished, female in no acute distress. Head: Normocephalic, atraumatic.  Neck: Supple without bruits, JVD not elevated. Lungs:  Resp regular and unlabored, CTA without wheezing or rales. Heart: RRR, S1, S2, no S3, S4, or murmur; no rub. Abdomen: Soft, non-tender, non-distended with normoactive bowel sounds. No hepatomegaly. No rebound/guarding. No obvious abdominal masses. Extremities: No clubbing, cyanosis, or edema. Distal pedal pulses are 2+ bilaterally. Neuro: Alert and oriented X 3. Moves all extremities spontaneously. Psych: Normal affect.   LABS: CBC:  Recent Labs  12/12/14 1021 12/12/14 1602 12/13/14 0514  WBC 5.2 6.0  5.8  NEUTROABS 3.1  --  3.7  HGB 12.5 12.3 11.3*  HCT 36.9 37.0 35.3*  MCV 94.1 93.4 94.6  PLT 222 234 211   INR:No results for input(s): INR in the last 72 hours. Basic Metabolic Panel:  Recent Labs  12/12/14 1021 12/12/14 1602 12/13/14 0514  NA 131*  --  137  K 3.2*  --  3.4*  CL 96*  --  104  CO2 27  --  25  GLUCOSE 111*  --  104*  BUN 9  --  10  CREATININE 0.92 1.07* 0.92  CALCIUM 9.8  --  9.1  MG  --  2.2  --    Liver Function Tests:  Recent Labs  12/12/14 1021  AST 18  ALT 15  ALKPHOS 57  BILITOT 0.4  PROT 6.7  ALBUMIN 4.3   Cardiac Enzymes:  Recent Labs  12/12/14 1602 12/12/14 2153  TROPONINI <0.03 <0.03    Recent Labs  12/12/14 1030  TROPIPOC 0.00   BNP:  B NATRIURETIC PEPTIDE  Date/Time Value Ref Range Status  10/05/2014 12:57 PM 246.7* 0.0 - 100.0 pg/mL Final   PRO B NATRIURETIC PEPTIDE (BNP)  Date/Time Value Ref Range Status  08/13/2013 03:59 PM 115.3 0 - 450 pg/mL Final   TELE:  NSR with rate in 50's - 80's.   Occasional PVC's noted.    ECG: NSR. T-wave inversion in V1 and V2. LVH noted.  Radiology/Studies: Dg Chest 2 View: 12/12/2014   CLINICAL DATA:  LEFT-sided chest pain and headache for 2 days. Initial encounter.  EXAM: CHEST  2 VIEW  COMPARISON:  10/05/2014.  FINDINGS: Cardiopericardial silhouette within normal limits. Mediastinal contours normal. Trachea midline. No airspace disease or effusion. Aortic arch atherosclerosis. Monitoring leads project over the abdomen.  IMPRESSION: No active cardiopulmonary disease.   Electronically Signed   By: Dereck Ligas M.D.   On: 12/12/2014 10:52   Ct Head Wo Contrast: 12/12/2014   CLINICAL DATA:  Headache and dizziness for 2 days.  EXAM: CT HEAD WITHOUT CONTRAST  TECHNIQUE: Contiguous axial images were obtained from the base of the skull through the vertex without intravenous contrast.  COMPARISON:  COMPARISON PET-CT scan on 11/05/2014 and 04/25/2006.  FINDINGS: Small hyperdense lesion along  the falx extending anterior to the left frontal lobe is again seen and unchanged in appearance. Note is made that the lesion tapers as it extends laterally rather than layering along the convexities. Given stability and configuration, it likely represents a meningioma rather than hemorrhage as described on the prior exam. It is new since the 04/25/2006 examination.  Mild atrophy and chronic microvascular ischemic change are again seen. No hemorrhage, hydrocephalus, infarct, midline shift or abnormal extra-axial fluid collection is identified. No hydrocephalus or pneumocephalus. The calvarium is intact. Imaged paranasal sinuses and mastoid air cells are clear.  IMPRESSION: No acute intracranial abnormality.  Small hyperdense lesion over the anterior left frontal lobe is likely a meningioma rather than hemorrhage as described on report of the most recent examination.  Atrophy and chronic microvascular ischemic change.   Electronically Signed   By: Inge Rise M.D.   On: 12/12/2014 11:43     Current Medications:  . amLODipine  10 mg Oral Daily  . dorzolamide  1 drop Both Eyes BID  . enoxaparin (LOVENOX) injection  40 mg Subcutaneous Q24H  . latanoprost  1 drop Both Eyes QHS  . lisinopril  40 mg Oral Daily  . metoprolol  50 mg Oral BID  . pravastatin  40 mg Oral QODAY      ASSESSMENT AND PLAN:  1. Chest Pain - developed on 12/11/2014 while at rest and was radiating to the neck. The pain was recurrent every 10-15 minutes overnight while at rest which prompted her to come to the ED on 10/62/6948.  - cyclic troponin values have been negative. - she denies any recurrence in her chest pain since being admitted. EKG has shown T-wave inversion in V1 and V2.  - she was scheduled for a Myoview this morning but declined the testing due to wanting to go home and "not wanting to take another medication" for the test. Her EKG results were reviewed with her. The patient and her husband are in agreement that  she wants to go home today. They have agreed to have an outpatient stress test in the coming week. - continue home medications.  Will schedule outpatient Lexiscan and Cardiology follow-up.  Arna Medici , PA-C 11:37 AM 9/28/2016Pager: [phone]   I have seen, examined and evaluated the patient this PM along with Ms. Ahmed Prima, PA.  After reviewing all the available data and chart,  I agree with her findings, examination as well as impression recommendations.  No further CP - but EKG is a bit concerning in Septal leads with more pronounced TWI. Pain was always @ rest & not exertional.   She has ruled out for MI -- She would prefer to  go home & is agreeable to outpatient ST.   [place] has arranged OP Myoview & f/u with Dr. Oval Linsey @ [place] office.  Would d/c with [facility] NTG PRN   HARDING, Leonie Green, M.D., M.S. Interventional Cardiologist   Pager # [phone]

## 2014-12-13 NOTE — Progress Notes (Signed)
Subjective: No acute events overnight.  No longer having chest pain, no palpitations, no shortness of breath.  Scheduled for [employer] today.  However, does not want to do this as her chest pain has resolved and does not want another test.  Objective: Vital signs in last 24 hours: Filed Vitals:   12/13/14 0142 12/13/14 0430 12/13/14 0851 12/13/14 1117  BP: 153/58 153/68 163/49 157/81  Pulse: 58 67 59 79  Temp: 98 F (36.7 C) 98 F (36.7 C) 97.8 F (36.6 C) 97.8 F (36.6 C)  TempSrc: Oral Oral Oral Oral  Resp: 18 18 16 16   Weight:  130 lb 11.2 oz (59.285 kg)    SpO2: 97% 97% 96% 98%   Weight change:   Intake/Output Summary (Last 24 hours) at 12/13/14 1317 Last data filed at 12/12/14 2330  Gross per 24 hour  Intake 478.33 ml  Output    500 ml  Net -21.67 ml   General: resting in bed, no distress HEENT: PERRL, EOMI, no scleral icterus Cardiac: RRR, no rubs, murmurs or gallops Pulm: clear to auscultation bilaterally, moving normal volumes of air Abd: soft, nontender, nondistended, BS present Ext: warm and well perfused, no pedal edema Neuro: alert and oriented X3, cranial nerves II-XII grossly intact  Lab Results: Basic Metabolic Panel:  Recent Labs Lab 12/12/14 1021 12/12/14 1602 12/13/14 0514  NA 131*  --  137  K 3.2*  --  3.4*  CL 96*  --  104  CO2 27  --  25  GLUCOSE 111*  --  104*  BUN 9  --  10  CREATININE 0.92 1.07* 0.92  CALCIUM 9.8  --  9.1  MG  --  2.2  --    Liver Function Tests:  Recent Labs Lab 12/12/14 1021  AST 18  ALT 15  ALKPHOS 57  BILITOT 0.4  PROT 6.7  ALBUMIN 4.3   No results for input(s): LIPASE, AMYLASE in the last 168 hours. No results for input(s): AMMONIA in the last 168 hours. CBC:  Recent Labs Lab 12/12/14 1021 12/12/14 1602 12/13/14 0514  WBC 5.2 6.0 5.8  NEUTROABS 3.1  --  3.7  HGB 12.5 12.3 11.3*  HCT 36.9 37.0 35.3*  MCV 94.1 93.4 94.6  PLT 222 234 211   Cardiac Enzymes:  Recent Labs Lab  12/12/14 1602 12/12/14 2153  TROPONINI <0.03 <0.03   BNP: No results for input(s): PROBNP in the last 168 hours. D-Dimer: No results for input(s): DDIMER in the last 168 hours. CBG: No results for input(s): GLUCAP in the last 168 hours. Hemoglobin A1C: No results for input(s): HGBA1C in the last 168 hours. Fasting Lipid Panel: No results for input(s): CHOL, HDL, LDLCALC, TRIG, CHOLHDL, LDLDIRECT in the last 168 hours. Thyroid Function Tests: No results for input(s): TSH, T4TOTAL, FREET4, T3FREE, THYROIDAB in the last 168 hours. Coagulation: No results for input(s): LABPROT, INR in the last 168 hours. Anemia Panel: No results for input(s): VITAMINB12, FOLATE, FERRITIN, TIBC, IRON, RETICCTPCT in the last 168 hours. Urine Drug Screen: Drugs of Abuse  No results found for: LABOPIA, COCAINSCRNUR, LABBENZ, AMPHETMU, THCU, LABBARB  Alcohol Level: No results for input(s): ETH in the last 168 hours. Urinalysis:  Recent Labs Lab 12/12/14 1155  COLORURINE YELLOW  LABSPEC 1.005  PHURINE 7.0  GLUCOSEU NEGATIVE  HGBUR TRACE*  BILIRUBINUR NEGATIVE  KETONESUR NEGATIVE  PROTEINUR NEGATIVE  UROBILINOGEN 0.2  NITRITE NEGATIVE  LEUKOCYTESUR TRACE*    Micro Results: No results found for this  or any previous visit (from the past 240 hour(s)). Studies/Results: Dg Chest 2 View  12/12/2014   CLINICAL DATA:  LEFT-sided chest pain and headache for 2 days. Initial encounter.  EXAM: CHEST  2 VIEW  COMPARISON:  10/05/2014.  FINDINGS: Cardiopericardial silhouette within normal limits. Mediastinal contours normal. Trachea midline. No airspace disease or effusion. Aortic arch atherosclerosis. Monitoring leads project over the abdomen.  IMPRESSION: No active cardiopulmonary disease.   Electronically Signed   By: Dereck Ligas M.D.   On: 12/12/2014 10:52   Ct Head Wo Contrast  12/12/2014   CLINICAL DATA:  Headache and dizziness for 2 days.  EXAM: CT HEAD WITHOUT CONTRAST  TECHNIQUE: Contiguous  axial images were obtained from the base of the skull through the vertex without intravenous contrast.  COMPARISON:  COMPARISON PET-CT scan on 11/05/2014 and 04/25/2006.  FINDINGS: Small hyperdense lesion along the falx extending anterior to the left frontal lobe is again seen and unchanged in appearance. Note is made that the lesion tapers as it extends laterally rather than layering along the convexities. Given stability and configuration, it likely represents a meningioma rather than hemorrhage as described on the prior exam. It is new since the 04/25/2006 examination.  Mild atrophy and chronic microvascular ischemic change are again seen. No hemorrhage, hydrocephalus, infarct, midline shift or abnormal extra-axial fluid collection is identified. No hydrocephalus or pneumocephalus. The calvarium is intact. Imaged paranasal sinuses and mastoid air cells are clear.  IMPRESSION: No acute intracranial abnormality.  Small hyperdense lesion over the anterior left frontal lobe is likely a meningioma rather than hemorrhage as described on report of the most recent examination.  Atrophy and chronic microvascular ischemic change.   Electronically Signed   By: Inge Rise M.D.   On: 12/12/2014 11:43   Medications:  Scheduled Meds: . amLODipine  10 mg Oral Daily  . dorzolamide  1 drop Both Eyes BID  . enoxaparin (LOVENOX) injection  40 mg Subcutaneous Q24H  . latanoprost  1 drop Both Eyes QHS  . lisinopril  40 mg Oral Daily  . metoprolol  50 mg Oral BID  . pravastatin  40 mg Oral QODAY   Continuous Infusions:  PRN Meds:.acetaminophen, butalbital-acetaminophen-caffeine, diazepam, loratadine, potassium chloride SA Assessment/Plan: Active Problems:   Anxiety state   Essential hypertension   Headache   Chest pain  81 y.o. AA female with past medical history of HTN, HLD, anxiety, recent diagnosed subdural hematoma.  Chest Pain: non-exertional, atypical features, no chest pain since admission, EKG with  some T-wave inversion V1 and V2, troponins negative x 3 -consult to cardiology, appreciate their recs -scheduled for Myoview this morning, however, patient is refusing.  Patient has agreed to to have an outpatient stress in the coming week.  Outpatient cardiology follow up has been scheduled.  Recent subdural hematoma -Conservative management per neurosurgery from August admission. CT scan today with possible meningioma instead of hemorrhage. Patient states she has scheduled outpatient follow up for this later this month.  HTN: home meds are Norvasc 10mg  daily, Lisinopril 40mg  daily, Lopressor 50mg  daily, and Lasix 20mg  daily -Continue at discharge  HLD: home meds are Pravastatin 40mg  daily -continue at discharge  Hypokalemia: 3.2 on admission. Magnesium 2.2 -Continue K-dur 82mEq daily   Dispo: Discharge home today with cardiology outpatient follow up.   The patient does have a current PCP Luna Fuse, MD) and does not need an [facility] hospital follow-up appointment after discharge.  The patient does not have transportation limitations that hinder transportation to  clinic appointments.    LOS: 1 day   Jule Ser, DO 12/13/2014, 1:17 PM

## 2014-12-13 NOTE — Discharge Instructions (Signed)
You have a Stress Test scheduled at [facility]. Your doctor has ordered this test to get a better idea of how your heart works.  Please arrive 15 minutes early for paperwork.   Location: [facility] [address] [place] [address] [phone]  Instructions:  No food/drink after midnight the night before.   No caffeine/decaf products 24 hours before, including medicines such as Excedrin or Goody Powders. Call if there are any questions.   Wear comfortable clothes and shoes.   It is OK to take your morning meds with a sip of water EXCEPT for those types of medicines listed below or otherwise instructed.  Special Medication Instructions:  Beta blockers such as metoprolol (Lopressor/Toprol XL), atenolol (Tenormin), carvedilol (Coreg), nebivolol (Bystolic), propranolol (Inderal) should not be taken for 24 hours before the test.  Calcium channel blockers such as diltiazem (Cardizem) or verapmil (Calan) should not be taken for 24 hours before the test.  Remove nitroglycerin patches and do not take nitrate preparations such as Imdur/isosorbide the day of your test.  No Persantine/Theophylline or Aggrenox medicines should be used within 24 hours of the test.   What To Expect: The whole test will take several hours. When you arrive in the lab, the technician will inject a small amount of radioactive tracer into your arm through an IV while you are resting quietly. This helps [place] to form pictures of your heart. You will likely only feel a sting from the IV. After a waiting period, resting pictures will be obtained under a big camera. These are the "before" pictures.  Next, you will be prepped for the stress portion of the test. This may include either walking on a treadmill or receiving a medicine that helps to dilate blood vessels in your heart to simulate the effect of exercise on your heart. If you are walking on a treadmill, you will walk at  different paces to try to get your heart rate to a goal number that is based on your age. If your doctor has chosen the pharmacologic test, then you will receive a medicine through your IV that may cause temporary nausea, flushing, shortness of breath and sometimes chest discomfort or vomiting. This is typically short-lived and usually resolves quickly. Your blood pressure and heart rate will be monitored, and we will be watching your EKG on a computer screen for any changes. During this portion of the test, the radiologist will inject another small amount of radioactive tracer into your IV. After a waiting period, you will undergo a second set of pictures. These are the "after" pictures.  The doctor reading the test will compare the before-and-after images to look for evidence of heart blockages or heart weakness. In certain instances, this test is done over 2 days but usually only takes 1 day to complete.

## 2014-12-13 NOTE — Discharge Summary (Signed)
Name: Ad[facility]ne Bright MRN: [id number] DOB: [birth year] 81 y.o. PCP: Luna Fuse, MD  Date of Admission: 12/12/2014  9:56 AM Date of Discharge: 12/13/2014 Attending Physician: No att. providers found  Discharge Diagnosis: Principal Problem:   Chest pain with moderate risk for cardiac etiology Active Problems:   Anxiety state   Essential hypertension   Headache  Discharge Medications:   Medication List    TAKE these medications        acetaminophen 325 MG tablet  Commonly known as:  TYLENOL  Take 2 tablets (650 mg total) by mouth every 6 (six) hours as needed for mild pain (or Fever >/= 101).     amLODipine 10 MG tablet  Commonly known as:  NORVASC  Take 10 mg by mouth daily.     Butalbital-APAP-Caffeine 50-300-40 MG Caps  Take 1 tablet by mouth as needed (migraines).     diazepam 5 MG tablet  Commonly known as:  VALIUM  Take 2.5 mg by mouth 2 (two) times daily as needed for anxiety (anxiety).     dorzolamide 2 % ophthalmic solution  Commonly known as:  TRUSOPT  Place 1 drop into both eyes 2 (two) times daily.     furosemide 20 MG tablet  Commonly known as:  LASIX  Take 20 mg by mouth daily as needed for fluid.     KLOR-CON M20 20 MEQ tablet  Generic drug:  potassium chloride SA  Take 20 mEq by mouth daily as needed (when taking furosemide).     latanoprost 0.005 % ophthalmic solution  Commonly known as:  XALATAN  Place 1 drop into both eyes at bedtime.     lisinopril 40 MG tablet  Commonly known as:  PRINIVIL,ZESTRIL  Take 40 mg by mouth daily.     loratadine 10 MG tablet  Commonly known as:  CLARITIN  Take 10 mg by mouth daily as needed for allergies.     meloxicam 7.5 MG tablet  Commonly known as:  MOBIC  Take 7.5 mg by mouth 2 (two) times daily as needed for pain (inflammation).     metoprolol 50 MG tablet  Commonly known as:  LOPRESSOR  Take 50 mg by mouth 2 (two) times daily.     pravastatin 40 MG tablet  Commonly known as:  PRAVACHOL    Take 40 mg by mouth every other day.        Disposition and follow-up:   [facility] Bright was discharged from [facility] in Stable condition.  At the hospital follow up visit please address:  1.  Patient needs to follow up for outpatient cardiology stress test as she did not want to pursue this in the hospital.  Appointments have been made with her for Oct 6.  2.  Labs / imaging needed at time of follow-up: n/a  3.  Pending labs/ test needing follow-up: n/a  Follow-up Appointments: Follow-up Information    Follow up with [facility] On 12/21/2014.   Why:  [facility] Cardiology Test on 12/21/2014 at 9:30AM. Nothing to eat or drink after midnight before your test except sips of water with medication.    Contact information:   [address] [place] [address] [phone]      Follow up with Sharol Harness, MD On 01/02/2015.   Specialty:  Cardiology   Why:  Cardiology Hospital Follow-Up Appointment on 01/02/2015 at 3:00PM   Contact information:   [address] [place] [address] [phone]  Follow up with Luna Fuse, MD. Schedule an appointment as soon as possible for a visit in 1 week.   Specialty:  Family Medicine   Contact information:   [address] [place] [address]       Discharge Instructions: Discharge Instructions    Diet - low sodium heart healthy    Complete by:  As directed      Increase activity slowly    Complete by:  As directed            Consultations: Treatment Team:  Rounding Lbcardiology, MD  Procedures Performed:  Dg Chest 2 View  12/12/2014   CLINICAL DATA:  LEFT-sided chest pain and headache for 2 days. Initial encounter.  EXAM: CHEST  2 VIEW  COMPARISON:  10/05/2014.  FINDINGS: Cardiopericardial silhouette within normal limits. Mediastinal contours normal. Trachea midline. No airspace disease or effusion. Aortic arch atherosclerosis. Monitoring leads  project over the abdomen.  IMPRESSION: No active cardiopulmonary disease.   Electronically Signed   By: Dereck Ligas M.D.   On: 12/12/2014 10:52   Ct Head Wo Contrast  12/12/2014   CLINICAL DATA:  Headache and dizziness for 2 days.  EXAM: CT HEAD WITHOUT CONTRAST  TECHNIQUE: Contiguous axial images were obtained from the base of the skull through the vertex without intravenous contrast.  COMPARISON:  COMPARISON PET-CT scan on 11/05/2014 and 04/25/2006.  FINDINGS: Small hyperdense lesion along the falx extending anterior to the left frontal lobe is again seen and unchanged in appearance. Note is made that the lesion tapers as it extends laterally rather than layering along the convexities. Given stability and configuration, it likely represents a meningioma rather than hemorrhage as described on the prior exam. It is new since the 04/25/2006 examination.  Mild atrophy and chronic microvascular ischemic change are again seen. No hemorrhage, hydrocephalus, infarct, midline shift or abnormal extra-axial fluid collection is identified. No hydrocephalus or pneumocephalus. The calvarium is intact. Imaged paranasal sinuses and mastoid air cells are clear.  IMPRESSION: No acute intracranial abnormality.  Small hyperdense lesion over the anterior left frontal lobe is likely a meningioma rather than hemorrhage as described on report of the most recent examination.  Atrophy and chronic microvascular ischemic change.   Electronically Signed   By: Inge Rise M.D.   On: 12/12/2014 11:43    2D Echo: n/a  Cardiac Cath: n/a  Admission HPI: Adrienne Bright is a 81 y.o. AA female with past medical history of HTN, HLD, anxiety, recent diagnosed subdural hematoma. However, head CT today reports a small hyperdense lesion over the anterior left frontal lobe is likely a meningioma rather than hemorrhage as described on report of the most recent examination. Today, she presented with left-sided chest pain  described as sharp, knife-like, and stabbing. Chest pain began the day before. States the pain would occur intermittently and last for 5-10 minutes before subsiding. States that she cannot associate any aggravating factors such as palpation, inspiration, movement. States she went about her day as usual, including going for a walk. This did not alleviate or aggravate the pain. Overnight, she continued to have this on-and-off chest pain and decided to come to the emergency department for evaluation. Upon arrival to the emergency department, she also complained of headache. With her recent history of subdural hematoma, a CT head was ordered with results as described above. Patient has no significant cardiac history or family history of early onset CAD. She was followed by Dr. Tami Ribas approximately 6 years ago for  HTN management. She denies any prior history of MI. Leading up to this most recent admission, patient was hospitalized last month when she fell off the bed and hit her head. She was evaluated by neurosurgery at that time with CT head showing a 66mm left frontal subdural hematoma. Repeat CT of the head showed stable subdural hematoma and, thus, was managed conservatively.   Today in the ED, she had a head CT as described above, EKG showed no significant ST-T wave changes, troponin negative, urinalysis negative for UTI, and chest xray negative.  Hospital Course by problem list: Principal Problem:   Chest pain with moderate risk for cardiac etiology Active Problems:   Anxiety state   Essential hypertension   Headache   Chest Pain: non-exertional, atypical features, no chest pain since being admitted, EKG with some T-wave inversion V1 and V2, troponins negative x 3 -consult to cardiology, appreciate their recs -scheduled for Myoview on morning of discharge, however, patient is refusing and prefers to go home. Patient has agreed to to have an outpatient stress test. Outpatient  cardiology follow up has been scheduled.  HTN: home meds are Norvasc 10mg  daily, Lisinopril 40mg  daily, Lopressor 50mg  daily, and Lasix 20mg  daily -Continue at discharge  HLD: home meds are Pravastatin 40mg  daily -continue at discharge  Hypokalemia: 3.2 on admission. Magnesium 2.2 -Continue K-dur 57mEq daily   Discharge Vitals:   BP 157/81 mmHg  Pulse 79  Temp(Src) 97.8 F (36.6 C) (Oral)  Resp 16  Wt 130 lb 11.2 oz (59.285 kg)  SpO2 98%  Discharge Labs:  No results found for this or any previous visit (from the past 24 hour(s)).  Signed: Jule Ser, DO 12/14/2014, 10:18 PM    Services Ordered on Discharge: none Equipment Ordered on Discharge: none

## 2014-12-19 ENCOUNTER — Telehealth (HOSPITAL_COMMUNITY): Payer: Self-pay

## 2014-12-19 NOTE — Telephone Encounter (Signed)
Encounter complete. 

## 2014-12-21 ENCOUNTER — Ambulatory Visit (HOSPITAL_COMMUNITY)
Admission: RE | Admit: 2014-12-21 | Discharge: 2014-12-21 | Disposition: A | Discharge status: Home / Self Care | Payer: Medicare Other | Source: Ambulatory Visit | Attending: Internal Medicine | Admitting: Internal Medicine

## 2014-12-21 DIAGNOSIS — F419 Anxiety disorder, unspecified: Secondary | ICD-10-CM | POA: Diagnosis not present

## 2014-12-21 DIAGNOSIS — R079 Chest pain, unspecified: Secondary | ICD-10-CM | POA: Insufficient documentation

## 2014-12-21 DIAGNOSIS — I10 Essential (primary) hypertension: Secondary | ICD-10-CM | POA: Insufficient documentation

## 2014-12-21 MED ORDER — AMINOPHYLLINE 25 MG/ML IV SOLN
75.0000 mg | Freq: Once | INTRAVENOUS | Status: AC
  Administered 2014-12-21: 75 mg via INTRAVENOUS

## 2014-12-21 MED ORDER — TECHNETIUM TC 99M SESTAMIBI GENERIC - CARDIOLITE
11.0000 | Freq: Once | INTRAVENOUS | Status: AC | PRN
  Administered 2014-12-21: 11 via INTRAVENOUS

## 2014-12-21 MED ORDER — TECHNETIUM TC 99M SESTAMIBI GENERIC - CARDIOLITE
32.1000 | Freq: Once | INTRAVENOUS | Status: AC | PRN
  Administered 2014-12-21: 32.1 via INTRAVENOUS

## 2014-12-21 MED ORDER — REGADENOSON 0.4 MG/5ML IV SOLN
0.4000 mg | Freq: Once | INTRAVENOUS | Status: AC
  Administered 2014-12-21: 0.4 mg via INTRAVENOUS

## 2014-12-22 LAB — MYOCARDIAL PERFUSION IMAGING
CHL CUP NUCLEAR SDS: 2
CSEPPHR: 109 {beats}/min
LV dias vol: 51 mL
LVSYSVOL: 19 mL
Rest HR: 83 {beats}/min
SRS: 3
SSS: 5
TID: 0.95

## 2015-01-02 ENCOUNTER — Encounter: Payer: Self-pay | Admitting: Cardiovascular Disease

## 2015-01-02 ENCOUNTER — Ambulatory Visit (INDEPENDENT_AMBULATORY_CARE_PROVIDER_SITE_OTHER): Payer: Medicare Other | Admitting: Cardiovascular Disease

## 2015-01-02 VITALS — BP 142/74 | HR 76 | Ht 62.0 in | Wt 133.0 lb

## 2015-01-02 DIAGNOSIS — R600 Localized edema: Secondary | ICD-10-CM

## 2015-01-02 MED ORDER — CARVEDILOL 25 MG PO TABS: 25.0000 mg | ORAL_TABLET | Freq: Two times a day (BID) | ORAL | Status: AC

## 2015-01-02 NOTE — Patient Instructions (Signed)
Dr Oval Linsey has recommended making the following medication changes: STOP Amlodipine STOP Metoprolol START Carvedilol 25 mg - take 1 tablet (25 mg total) by mouth twice daily. A new prescription has been sent to your pharmacy electronically.  Your physician has requested that you have an echocardiogram. Echocardiography is a painless test that uses sound waves to create images of your heart. It provides your doctor with information about the size and shape of your heart and how well your heart's chambers and valves are working. This procedure takes approximately one hour. There are no restrictions for this procedure. These tests will be done at our [facility] location. The address is [address], [address]. The phone number is [phone] if you have any concerns or need directions.  Dr Oval Linsey recommends that you schedule a follow-up appointment in 1 month.

## 2015-01-02 NOTE — Progress Notes (Signed)
Cardiology Office Note   Date:  01/02/2015   ID:  Adrienne Bright, DOB 1928-04-06, MRN [id number]  PCP:  Adrienne Fuse, MD  Cardiologist:   Sharol Harness, MD   Chief Complaint  Patient presents with  . Hospitalization Follow-up    patient reports leg tightness and swelling - bilateral, neck pain, and right arm pain and tingling - thumb stays numb.      Bright of Present Illness: Adrienne Bright is a 81 y.o. female with hypertension and prior subdural hematoma (2/2 trauma) who presents for an evaluation of chest pain.  [facility] Bright initially presented to [facility] on 12/12/14 with an episode of chest pain.  Her cardiac enzymes were negative and EKG was unremarkable.  She was referred for inpatient stress, but refused.  She subsequently underwent stress on 10/6 that was negative for ischemia.  Since being at [facility] hospital she has noted swelling in her feet and legs for 7-8 months.  It improves with elevation.  Her legs feel tight.  She denies shortness of breath, orthopnea, or PND.  She denies chest pain, lightheadedness or dizziness.  She has tried increasing her lasix but continued to have edema with lasix 40 mg daily.  [facility]ne Bright also notes neck and arm pain.  She also reports headaches since falling 2 months ago. She had a SDH that resolved on follow up CT.      Past Medical Bright  Diagnosis Date  . Hypertension   . Hypercalcemia   . Headache   . Hypokalemia   . Arthritis     ra    Past Surgical Bright  Procedure Laterality Date  . Vocal cord nodule    . Cataract extraction    . Nasal sinus surgery    . Tubal ligation       Current Outpatient Prescriptions  Medication Sig Dispense Refill  . acetaminophen (TYLENOL) 325 MG tablet Take 2 tablets (650 mg total) by mouth every 6 (six) hours as needed for mild pain (or Fever >/= 101). 30 tablet 3  . amLODipine (NORVASC) 10 MG tablet Take 10 mg by mouth daily.    . Butalbital-APAP-Caffeine 50-300-40 MG  CAPS Take 1 tablet by mouth as needed (migraines).   5  . diazepam (VALIUM) 5 MG tablet Take 2.5 mg by mouth 2 (two) times daily as needed for anxiety (anxiety).     . dorzolamide (TRUSOPT) 2 % ophthalmic solution Place 1 drop into both eyes 2 (two) times daily.  12  . furosemide (LASIX) 20 MG tablet Take 20 mg by mouth daily as needed for fluid.   5  . KLOR-CON M20 20 MEQ tablet Take 20 mEq by mouth daily as needed (when taking furosemide).   5  . latanoprost (XALATAN) 0.005 % ophthalmic solution Place 1 drop into both eyes at bedtime.  11  . lisinopril (PRINIVIL,ZESTRIL) 40 MG tablet Take 40 mg by mouth daily.  5  . loratadine (CLARITIN) 10 MG tablet Take 10 mg by mouth daily as needed for allergies.    . meloxicam (MOBIC) 7.5 MG tablet Take 7.5 mg by mouth 2 (two) times daily as needed for pain (inflammation).   2  . metoprolol (LOPRESSOR) 50 MG tablet Take 50 mg by mouth 2 (two) times daily.    . pravastatin (PRAVACHOL) 40 MG tablet Take 40 mg by mouth every other day.     . traMADol (ULTRAM) 50 MG tablet Take 25 mg by mouth daily as needed.  2   No current facility-administered medications for this visit.    Allergies:   Propranolol hcl; Benazepril hcl; Amlodipine besylate; and Trandolapril    Social Bright:  [facility] patient  reports that she has never smoked. Her smokeless tobacco use includes Snuff. She reports that she does not drink alcohol or use illicit drugs.   Family Bright:  [facility]ne Bright is negative for CAD.    ROS:  Please see [facility] Bright of present illness.   Otherwise, review of systems are positive for neck pain, arm pain, vocal cord dysfunction.   All other systems are reviewed and negative.    PHYSICAL EXAM: VS:  BP 142/74 mmHg  Pulse 76  Ht 5\' 2"  (1.575 m)  Wt 60.328 kg (133 lb)  BMI 24.32 kg/m2 , BMI Body mass index is 24.32 kg/(m^2). GENERAL:  Well appearing HEENT:  Pupils equal round and reactive, fundi not visualized, oral mucosa  unremarkable NECK:  No jugular venous distention, waveform within normal limits, carotid upstroke brisk and symmetric, no bruits, no thyromegaly LYMPHATICS:  No cervical adenopathy LUNGS:  Clear to auscultation bilaterally HEART:  RRR.  PMI not displaced or sustained,S1 and S2 within normal limits, no S3, no S4, no clicks, no rubs, no murmurs ABD:  Flat, positive bowel sounds normal in frequency in pitch, no bruits, no rebound, no guarding, no midline pulsatile mass, no hepatomegaly, no splenomegaly EXT:  2 plus pulses throughout, 2+ edema to [facility] ankles bilaterally, no cyanosis no clubbing SKIN:  No rashes no nodules NEURO:  Cranial nerves II through XII grossly intact, motor grossly intact throughout PSYCH:  Cognitively intact, oriented to person place and time    EKG:  EKG is  Not ordered today.  [facility] myoview 12/21/14:  Nuclear stress EF: 62%.  [facility] left ventricular ejection fraction is normal (55-65%).  [facility] study is normal.  This is a low risk study.  1. Low risk study 2. Nl [facility] myoview   Recent Labs: 10/05/2014: B Natriuretic Peptide 246.7* 11/06/2014: TSH 0.909 12/12/2014: ALT 15; Magnesium 2.2 12/13/2014: BUN 10; Creatinine, Ser 0.92; Hemoglobin 11.3*; Platelets 211; Potassium 3.4*; Sodium 137    Lipid Panel    Component Value Date/Time   CHOL 248* 01/16/2009 0017   TRIG 110 01/16/2009 0017   HDL 88 01/16/2009 0017   CHOLHDL 2.8 Ratio 01/16/2009 0017   VLDL 22 01/16/2009 0017   LDLCALC 138* 01/16/2009 0017      Wt Readings from Last 3 Encounters:  01/02/15 60.328 kg (133 lb)  12/21/14 58.968 kg (130 lb)  12/13/14 59.285 kg (130 lb 11.2 oz)      ASSESSMENT AND PLAN:  # Edema: [facility] Bright JVD is not elevated and her symptoms did not improved with lasix.  I'm suspicious that amlodipine may be contributed to her edema. Therefore we will stop amlodipine and start carvedilol 25 mg twice a day. We will stop her metoprolol given that we are starting  Coreg.  We will also check an echo to rule out structural heart disease.  Continue lasix 20 mg daily.    # Hypertension: Stopping amlodipine and metoprolol to start carvedilol as above.  She will return in one month for blood pressure check.   Current medicines are reviewed at length with [facility] patient today.  [facility] patient does not have concerns regarding medicines.  [facility] following changes have been made:  Stop amlodipine and metoprolol.  Start carvedilol.   Labs/ tests ordered today include:  No orders of [facility] defined  types were placed in this encounter.     Disposition:   FU with Gaylin Bulthuis C. Oval Linsey, MD in 1 month.    Signed, Sharol Harness, MD  01/02/2015 2:47 PM    [place]

## 2015-01-16 ENCOUNTER — Other Ambulatory Visit (HOSPITAL_COMMUNITY): Payer: Medicare Other

## 2015-02-02 ENCOUNTER — Ambulatory Visit: Payer: Medicare Other | Admitting: Cardiovascular Disease

## 2015-05-22 ENCOUNTER — Ambulatory Visit: Payer: Medicare Other

## 2015-05-28 ENCOUNTER — Ambulatory Visit: Payer: Medicare Other | Attending: Otolaryngology

## 2015-05-28 DIAGNOSIS — R498 Other voice and resonance disorders: Secondary | ICD-10-CM | POA: Diagnosis not present

## 2015-05-28 NOTE — Patient Instructions (Signed)
  PRACTICE LOUD "HEY!!" 5 times. Do this three times a day.  When you talk, put some power behind it!!

## 2015-05-29 NOTE — Therapy (Signed)
[name] [address] [place], [place], [address] Phone: [phone]   Fax:  [phone]  Speech Language Pathology Evaluation  Patient Details  Name: Adrienne Bright MRN: [id number] Date of Birth: 10/13/1928 Referring Provider: Melida Quitter  Encounter Date: 05/28/2015      End of Session - 05/29/15 0841    Visit Number 1   Number of Visits 17   Date for SLP Re-Evaluation 07/30/15   SLP Start Time 65   SLP Stop Time  1230   SLP Time Calculation (min) 40 min   Activity Tolerance Patient tolerated treatment well      Past Medical History  Diagnosis Date  . Hypertension   . Hypercalcemia   . Headache   . Hypokalemia   . Arthritis     ra    Past Surgical History  Procedure Laterality Date  . Vocal cord nodule    . Cataract extraction    . Nasal sinus surgery    . Tubal ligation      There were no vitals filed for this visit.  Visit Diagnosis: Voice disorder      Subjective Assessment - 05/28/15 1155    Subjective Pt had vocal nodules removed surgically >30 years ago   Currently in Pain? No/denies            SLP Evaluation [facility] - 05/28/15 1155    SLP Visit Information   SLP Received On 05/28/15   Referring Provider Melida Quitter   Onset Date 6 months   Medical Diagnosis Presbylaryngis   General Information   HPI Pt had vocal nodules removed x2 seperate surgeries >30 years ago   Prior Functional Status   Cognitive/Linguistic Baseline Within functional limits   Cognition   Overall Cognitive Status Within Functional Limits for tasks assessed   Auditory Comprehension   Overall Auditory Comprehension Appears within functional limits for tasks assessed   Verbal Expression   Overall Verbal Expression Appears within functional limits for tasks assessed       Pt sustained /a/ average 6 seconds, which is lower than WNL.Harsh voice and vocal fry noted consistently in this task. Her  average loudness in conversation was 68dB, also lower than WNL, which is 70-72dB, measured 30 cm away. Pt with frequent chest breathing, and with shallow breaths.                   SLP Education - 05/29/15 0841    Education provided Yes   Education Details loud "hey!"   Person(s) Educated Patient   Methods Demonstration;Explanation;Verbal cues   Comprehension Verbalized understanding;Returned demonstration;Verbal cues required;Need further instruction          SLP Short Term Goals - 05/29/15 0846    SLP SHORT TERM GOAL #1   Title pt will maintain loud "hey" or /a/ at average 90dB over three sessions   Time 4   Period Weeks   Status New   SLP SHORT TERM GOAL #2   Title pt will maintain loudness above 70dB in 19/20 sentence responses   Time 4   Period Weeks   Status New   SLP SHORT TERM GOAL #3   Title pt will demo abdominal breathing in 15/20 sentence responses with rare min A from SLP   Time 4   Period Weeks   Status New   SLP SHORT TERM GOAL #4   Title pt will score <45 on the Voice Handicap Index   Time 4  Period Weeks   Status New          SLP Long Term Goals - June 17, 2015 0847    SLP LONG TERM GOAL #1   Title pt will maintain loud /a/ or loud "hey" at 90eB over 5 sessions   Time 8   Period Weeks   Status New   SLP LONG TERM GOAL #2   Title pt will maintain voice at 70dB in 10 minutes conversation with rare min A for loudness/effort   Time 8   Period Weeks   Status New   SLP LONG TERM GOAL #3   Title pt will score <40 on Voice Handicap Index   Time 8   Period Weeks   Status New   SLP LONG TERM GOAL #4   Title pt will average at least 11 seconds when sustaining /a/   Time 8   Period Weeks   Status New          Plan - 2015/06/17 [id number]    Clinical Impression Statement Pt presents with weak voice that intermittently demonstrated vocal fry, resulting in llower than normal speech volume. This is due to reduced breath support for speech. When pt  increased her breath support/effort necessary for speech, her volume and vocal quality improved. Pt stated she noted an improvement in quality and stamina over a conversation of 1-2 minutes wiht occasional min-mod SLP cues for maintaining incr'd loudness/effort. Pt would benefit from skilled ST to carryover  speech with improved breath support and incr'd effort to conversation.    Speech Therapy Frequency 2x / week   Duration --  8 weeks   Treatment/Interventions SLP instruction and feedback;Compensatory strategies;Internal/external aids;Patient/family education;Functional tasks   Potential to Achieve Goals Good   Consulted and Agree with Plan of Care Patient          G-Codes - 06-17-2015 [id number]    Functional Assessment Tool Used noms - 5 (approx 50% impaired)   Functional Limitations Voice   Voice Current Status ([id number]) At least 40 percent but less than 60 percent impaired, limited or restricted   Voice Goal Status [id number]) At least 1 percent but less than 20 percent impaired, limited or restricted      Problem List Patient Active Problem List   Diagnosis Date Noted  . Chest pain with moderate risk for cardiac etiology 12/12/2014  . Meningioma ([place]) 11/05/2014  . Anxiety state 08/15/2006  . Essential hypertension 08/15/2006  . ALLERGIC RHINITIS 08/15/2006  . MENOPAUSAL DISORDER 08/15/2006  . ALOPECIA 08/15/2006  . SPONDYLOSIS, CERVICAL 08/15/2006  . Headache 08/15/2006    [facility] ,[place], [place]  2015/06/17, 9:24 AM  [facility] [address] [place], [place], [address] Phone: [phone]   Fax:  [phone]  Name: Adrienne Bright MRN: [id number] Date of Birth: April 04, 1928

## 2015-06-06 ENCOUNTER — Ambulatory Visit: Payer: Medicare Other

## 2015-06-06 DIAGNOSIS — R498 Other voice and resonance disorders: Secondary | ICD-10-CM | POA: Diagnosis not present

## 2015-06-06 NOTE — Therapy (Addendum)
[facility] [address] [place], [place], [address] Phone: [phone]   Fax:  [phone]  Speech Language Pathology Treatment  Patient Details  Name: JULIENNE GUNDY MRN: [id number] Date of Birth: [birth year] Referring Provider: Melida Quitter  Encounter Date: 06/06/2015      End of Session - 06/06/15 1300    Visit Number 2   Number of Visits 17   Date for SLP Re-Evaluation 07/30/15   SLP Start Time 1150   SLP Stop Time  1228   SLP Time Calculation (min) 38 min   Activity Tolerance Patient tolerated treatment well      Past Medical History  Diagnosis Date  . Hypertension   . Hypercalcemia   . Headache   . Hypokalemia   . Arthritis     ra    Past Surgical History  Procedure Laterality Date  . Vocal cord nodule    . Cataract extraction    . Nasal sinus surgery    . Tubal ligation      There were no vitals filed for this visit.  Visit Diagnosis: Other voice and resonance disorder - R49.8      Subjective Assessment - 06/06/15 1157    Subjective "It got lots better since you told me to do that hollering."   Currently in Pain? No/denies               ADULT SLP TREATMENT - 06/06/15 1158    General Information   Behavior/Cognition Alert;Cooperative;Pleasant mood   Treatment Provided   Treatment provided Cognitive-Linquistic   Cognitive-Linquistic Treatment   Treatment focused on Voice   Skilled Treatment Pt agrees her voice has improved - she reports quality today is 8/10 (10=WNL voice). SLP engaged pt in conversation for 25 minutes with pt's voice rated 7-8 after this time. SLP educated pt on abdominal breathing (AB) and pt practiced at rest with good success.    Assessment / Recommendations / Plan   Plan --  reduce to once a week due to progress   Progression Toward Goals   Progression toward goals Progressing toward goals          SLP Education - 06/06/15 1259    Education  provided Yes   Education Details abdominal breathing    Methods Explanation;Demonstration;Verbal cues   Comprehension Verbalized understanding;Returned demonstration          SLP Short Term Goals - 06/06/15 1214    SLP SHORT TERM GOAL #1   Title pt will maintain loud "hey" or /a/ at average 90dB over three sessions   Baseline one session 06-06-15   Time 4   Period Weeks   Status On-going   SLP SHORT TERM GOAL #2   Title pt will maintain loudness above 70dB in 19/20 sentence responses   Time --   Period --   Status Achieved   SLP SHORT TERM GOAL #3   Title pt will demo abdominal breathing in 15/20 sentence responses with rare min A from SLP   Time 4   Period Weeks   Status On-going   SLP SHORT TERM GOAL #4   Title pt will score <45 on the Voice Handicap Index   Time 4   Period Weeks   Status On-going          SLP Long Term Goals - 06/06/15 1214    SLP LONG TERM GOAL #1   Title pt will maintain loud /a/ or loud "hey" at 90eB over 5  sessions   Time 8   Period Weeks   Status On-going   SLP LONG TERM GOAL #2   Title pt will maintain voice at 70dB in 10 minutes conversation with rare min A for loudness/effort   Time 8   Period Weeks   Status On-going   SLP LONG TERM GOAL #3   Title pt will score <40 on Voice Handicap Index   Time 8   Period Weeks   Status On-going   SLP LONG TERM GOAL #4   Title pt will average at least 11 seconds when sustaining /a/   Time 8   Period Weeks   Status On-going          Plan - 06/06/15 1300    Clinical Impression Statement Skilled ST cont needed to consistently generalize increased breath support/effort necessary for speech across speaking situaiton. Pt would benefit from cont skilled ST for incr'd effort to incr intelligiiblity in conversation.    Speech Therapy Frequency 1x /week   Duration --  8 weeks   Treatment/Interventions SLP instruction and feedback;Compensatory strategies;Internal/external aids;Patient/family  education;Functional tasks   Potential to Achieve Goals Good   Consulted and Agree with Plan of Care Patient        Problem List Patient Active Problem List   Diagnosis Date Noted  . Chest pain with moderate risk for cardiac etiology 12/12/2014  . Meningioma ([place]) 11/05/2014  . Anxiety state 08/15/2006  . Essential hypertension 08/15/2006  . ALLERGIC RHINITIS 08/15/2006  . MENOPAUSAL DISORDER 08/15/2006  . ALOPECIA 08/15/2006  . SPONDYLOSIS, CERVICAL 08/15/2006  . Headache 08/15/2006    [facility] ,[place], [place]   06/06/2015, 1:04 PM  [facility] [address] [place], [place], [address] Phone: [phone]   Fax:  [phone]   Name: NADERA HOLLINGER MRN: [id number] Date of Birth: [birth year]

## 2015-06-07 ENCOUNTER — Ambulatory Visit: Payer: Medicare Other

## 2015-06-13 ENCOUNTER — Ambulatory Visit: Payer: Medicare Other

## 2015-06-13 DIAGNOSIS — R498 Other voice and resonance disorders: Secondary | ICD-10-CM | POA: Diagnosis not present

## 2015-06-13 NOTE — Therapy (Addendum)
[facility] [address] [place], [place], [address] Phone: [phone]   Fax:  [phone]  Speech Language Pathology Treatment  Patient Details  Name: Adrienne Bright MRN: [id number] Date of Birth: 1929/02/12 Referring Provider: Melida Quitter  Encounter Date: 06/13/2015      End of Session - 06/13/15 1344    Visit Number 3   Number of Visits 17   Date for SLP Re-Evaluation 07/30/15   SLP Start Time 9407   SLP Stop Time  1351  discharge day - pt satisfied with progress   SLP Time Calculation (min) 34 min   Activity Tolerance Patient tolerated treatment well      Past Medical History  Diagnosis Date  . Hypertension   . Hypercalcemia   . Headache   . Hypokalemia   . Arthritis     ra    Past Surgical History  Procedure Laterality Date  . Vocal cord nodule    . Cataract extraction    . Nasal sinus surgery    . Tubal ligation      There were no vitals filed for this visit.  Visit Diagnosis: Other Voice and Resonance disorder - R49.8      Subjective Assessment - 06/13/15 1321    Subjective Pt cont to perform loud /a/. "It helps a lot. It sure (does)."   Currently in Pain? No/denies               ADULT SLP TREATMENT - 06/13/15 1322    Treatment Provided   Treatment provided Cognitive-Linquistic   Cognitive-Linquistic Treatment   Treatment focused on Voice   Skilled Treatment Pt states her voice is 7/10 today, but she has not completed her "hollering." Reports voice is better quality after her practice with abdominal strength/push. SLP directed pt to complete abdominal push practice to familiarize pt with feeling of WNL abdominal movement for speaking. Afterwards pt rated her voice 8-9/10. Pt tells SLP she thinks she is ready for discharge. In 20 minutes of conversation pt maintained what she thought to be 9/10 (WNL=10). Pt's sustained /a/ was average 9 seconds, and voice handicap score was  significantly reduced, to 16 (original score was 50).    Assessment / Recommendations / Plan   Plan Discharge SLP treatment due to (comment)  pt satisfied with progress at this time   Progression Toward Goals   Progression toward goals --  see goal update            SLP Short Term Goals - 06/13/15 1356    SLP SHORT TERM GOAL #1   Title pt will maintain loud "hey" or /a/ at average 90dB over three sessions   Baseline one session 06-06-15   Time 4   Period Weeks   Status Partially Met  2   SLP SHORT TERM GOAL #2   Title pt will maintain loudness above 70dB in 19/20 sentence responses   Status Achieved   SLP SHORT TERM GOAL #3   Title pt will demo abdominal breathing in 15/20 sentence responses with rare min A from SLP   Time 4   Period Weeks   Status Deferred   SLP SHORT TERM GOAL #4   Title pt will score <45 on the Voice Handicap Index   Time 4   Period Weeks   Status Deferred  deferred to [facility] - 06/13/15 [place]  GOAL #1   Title pt will maintain loud /a/ or loud "hey" at 90eB over 5 sessions   Time 8   Period Weeks   Status Partially Met   SLP LONG TERM GOAL #2   Title pt will maintain voice at 70dB in 10 minutes conversation with rare min A for loudness/effort   Time 8   Period Weeks   Status Achieved   SLP LONG TERM GOAL #3   Title pt will score <40 on Voice Handicap Index   Time 8   Period Weeks   Status Achieved  Voice handicap index score=16   SLP LONG TERM GOAL #4   Title pt will average at least 11 seconds when sustaining /a/   Time 8   Period Weeks   Status Partially Met  9 seconds          Plan - 2015/06/25 1358    Clinical Impression Statement Pt reported being satisfied with her voice as it is today and indicates to SLP she does not think she requires further therapy. SLP agrees. Pt's voice handicap index score decr'd to 16 (from original score of 50), and her sustained /a/ increased to 9 seconds.     Treatment/Interventions SLP instruction and feedback;Compensatory strategies;Internal/external aids;Patient/family education;Functional tasks   Potential to Achieve Goals Good   Consulted and Agree with Plan of Care Patient          G-Codes - 25-Jun-2015 1400    Functional Assessment Tool Used noms - 6 (approx 10% impaired)   Functional Limitations Voice   Voice Goal Status ([id number]) At least 1 percent but less than 20 percent impaired, limited or restricted   Voice Discharge Status ([id number]) At least 1 percent but less than 20 percent impaired, limited or restricted     [place]  Visits from Start of Care: 3  Current functional level related to goals / functional outcomes: Please see "clinical impression statement" above, as well as short and long term goal sections. Pt's sustained /a/ improved to 9 seconds (from 6 seconds), and her voice handicap index score decr'd considerably to 16 (original was 50). Pt told SLP she is very satisfied with her voice and indicated she does not believe she needs further therapy. SLP agrees.   Remaining deficits: None   Education / Equipment: Abdominal push necessary for breathing, loud "hey"/ "ah" to practice abdominal push for speech.  Plan: Patient agrees to discharge.  Patient goals were partially met. Patient is being discharged due to being pleased with the current functional level.  ?????       Problem List Patient Active Problem List   Diagnosis Date Noted  . Chest pain with moderate risk for cardiac etiology 12/12/2014  . Meningioma ([place]) 11/05/2014  . Anxiety state 08/15/2006  . Essential hypertension 08/15/2006  . ALLERGIC RHINITIS 08/15/2006  . MENOPAUSAL DISORDER 08/15/2006  . ALOPECIA 08/15/2006  . SPONDYLOSIS, CERVICAL 08/15/2006  . Headache 08/15/2006    [facility] ,[place], [place]  2015-06-25, 2:01 PM  [facility] [address] [place], [place], [address] Phone: [phone]   Fax:  [phone]   Name: Adrienne Bright MRN: [id number] Date of Birth: 1928-05-25

## 2015-07-17 NOTE — Addendum Note (Signed)
Addended by: Garald Balding B on: 07/17/2015 05:08 PM   Modules accepted: Orders

## 2015-11-19 IMAGING — CT CT HEAD W/O CM
2 series · 15 of 30 positions shown, 17 images · non-contrast
Comparison: COMPARISON
PET-CT scan on 11/05/2014 and 04/25/2006.

CLINICAL DATA: Headache and dizziness for 2 days.

EXAM:
CT HEAD WITHOUT CONTRAST
TECHNIQUE: Contiguous axial images were obtained from the base of the skull
through the vertex without intravenous contrast.

[Series 2: head without · axial · non-contrast · 0.43mm/px · z∈[+1096,+1216]mm · 7 of 32 slices shown, 9 images]
[im 4/32  brain]
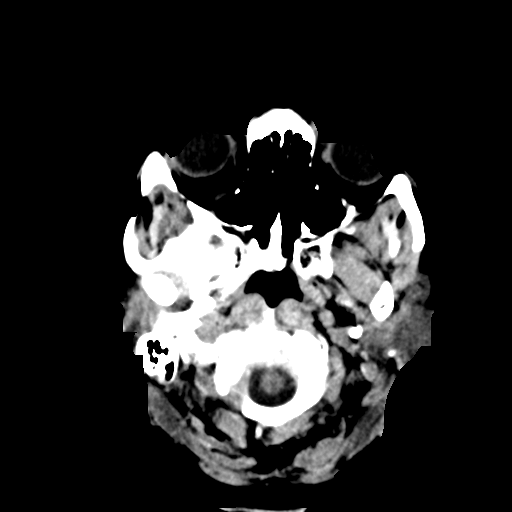
[im 4/32  bone]
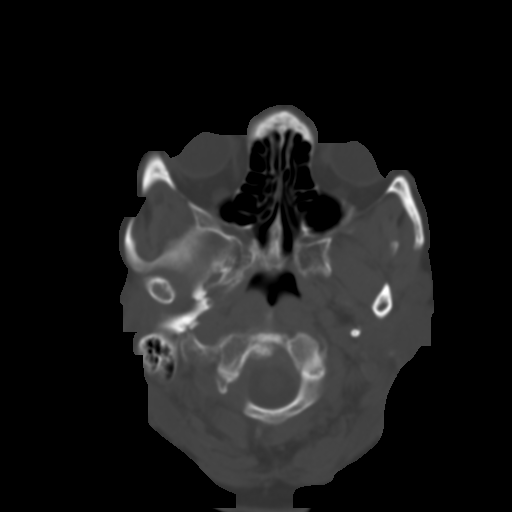
[im 8/32  brain]
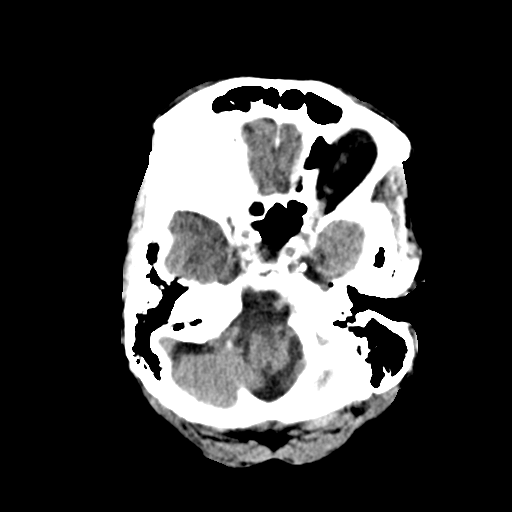
[im 12/32  brain]
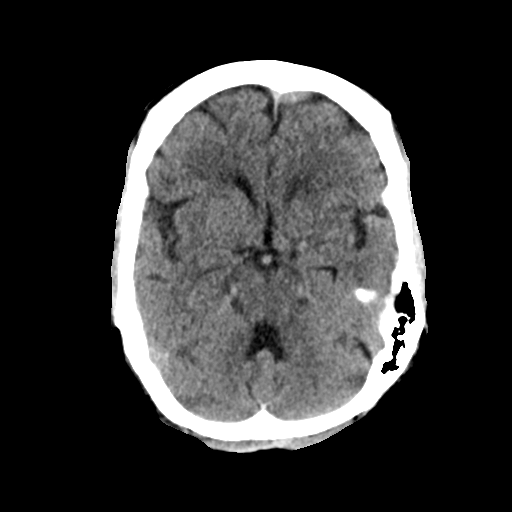
[im 16/32  brain]
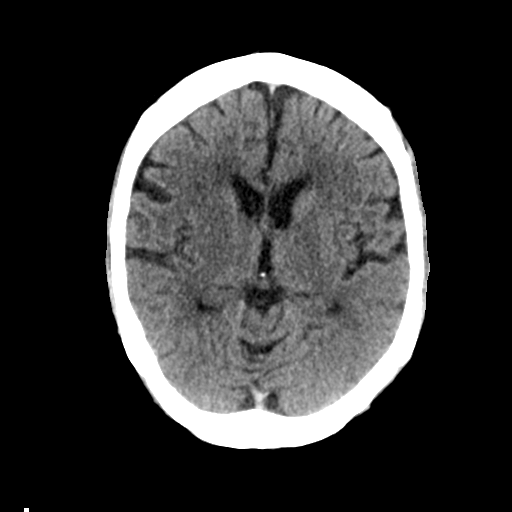
[im 20/32  brain]
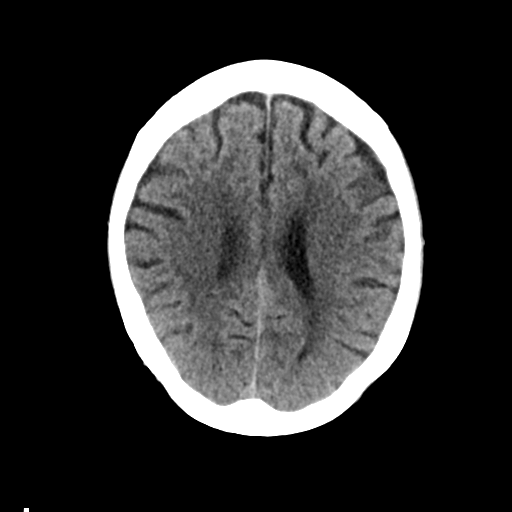
[im 20/32  bone]
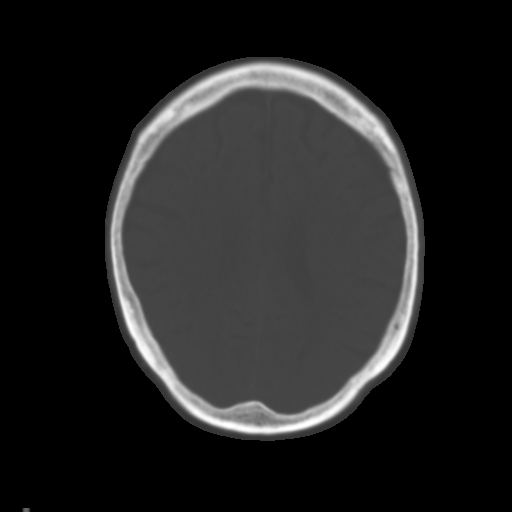
[im 24/32  brain]
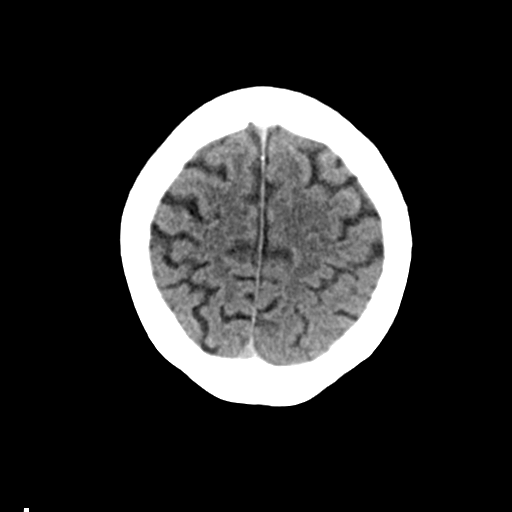
[im 28/32  brain]
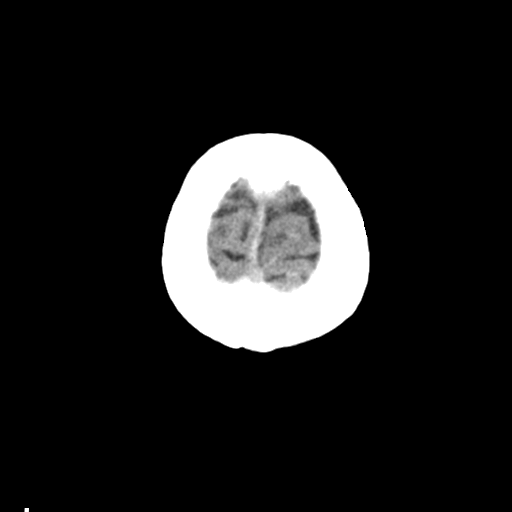

[Series 3: head bone · axial · 0.43mm/px · z∈[+1095,+1221]mm · 8 of 79 slices shown]
[im 8/79  bone]
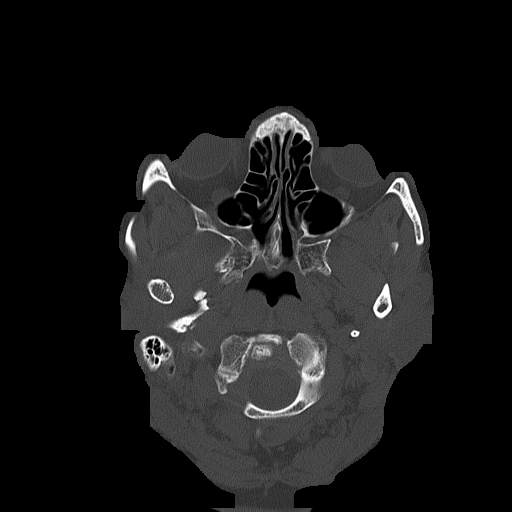
[im 16/79  bone]
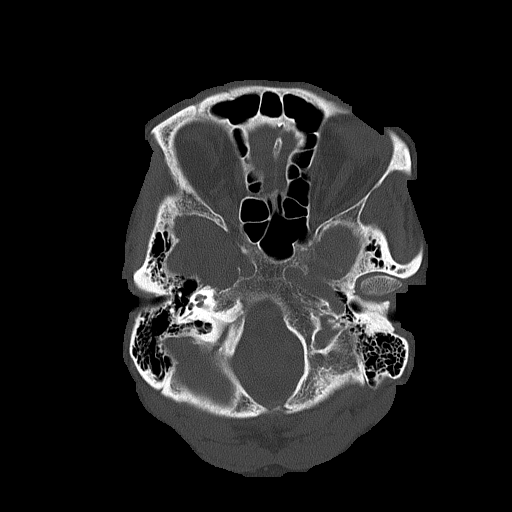
[im 24/79  bone]
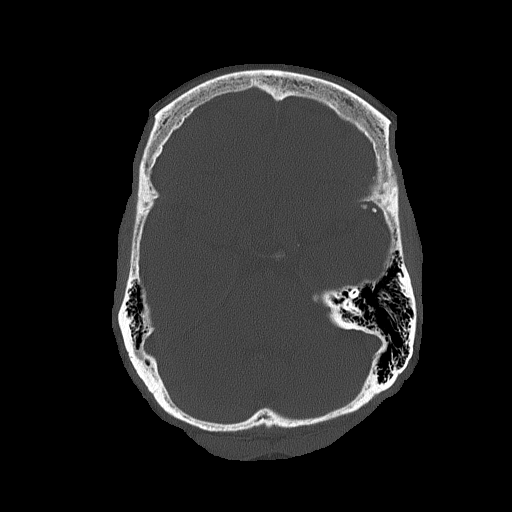
[im 36/79  bone]
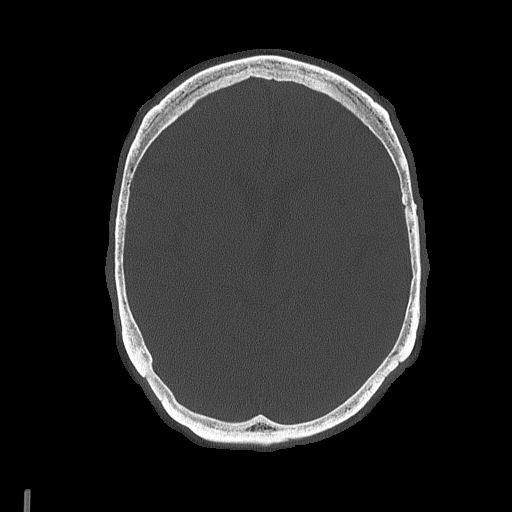
[im 43/79  bone]
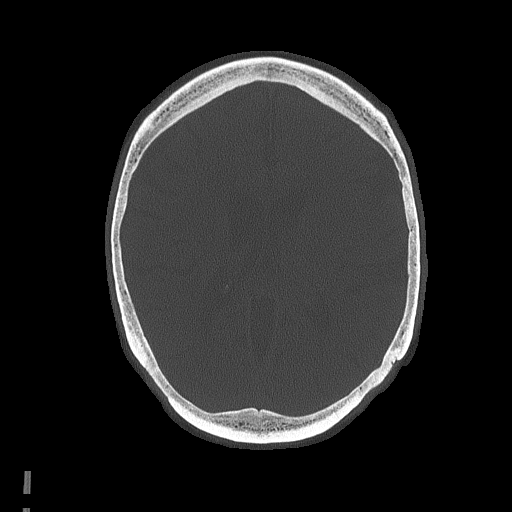
[im 55/79  bone]
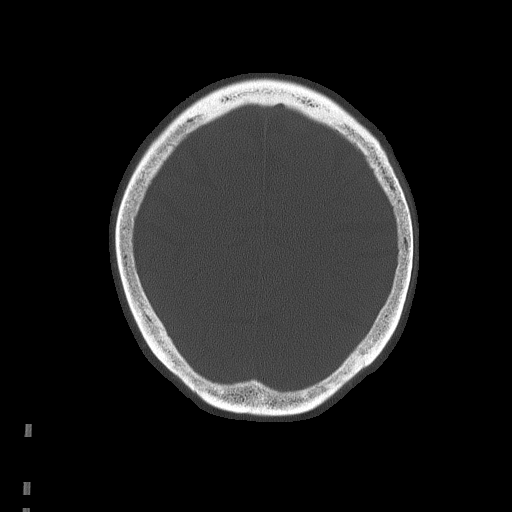
[im 63/79  bone]
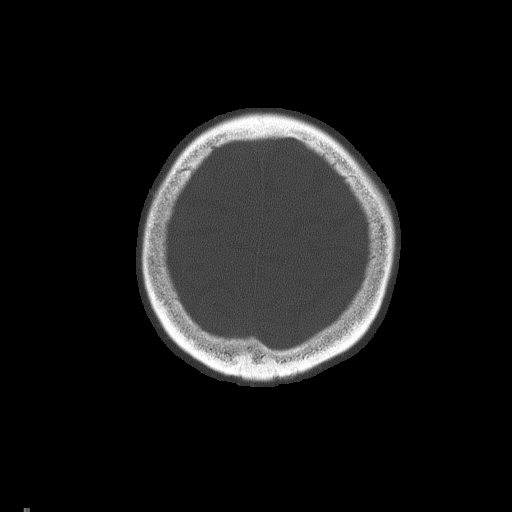
[im 71/79  bone]
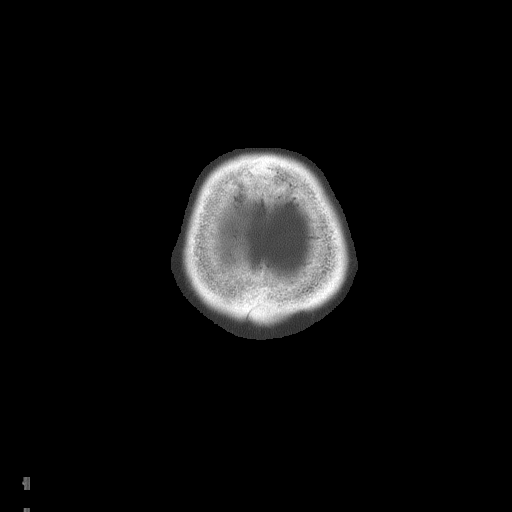

[15 of 30 positions shown; findings below may reference images not displayed]

FINDINGS: Small hyperdense lesion along the falx extending anterior to the
left frontal lobe is again seen and unchanged in appearance. Note is
made that the lesion tapers as it extends laterally rather than
layering along the convexities. Given stability and configuration,
it likely represents a meningioma rather than hemorrhage as
described on the prior exam. It is new since the 04/25/2006
examination.

Mild atrophy and chronic microvascular ischemic change are again
seen. No hemorrhage, hydrocephalus, infarct, midline shift or
abnormal extra-axial fluid collection is identified. No
hydrocephalus or pneumocephalus. The calvarium is intact. Imaged
paranasal sinuses and mastoid air cells are clear.
IMPRESSION: No acute intracranial abnormality.

Small hyperdense lesion over the anterior left frontal lobe is
likely a meningioma rather than hemorrhage as described on report of
the most recent examination.

Atrophy and chronic microvascular ischemic change.

## 2016-02-01 ENCOUNTER — Emergency Department (HOSPITAL_COMMUNITY)
Admission: EM | Admit: 2016-02-01 | Discharge: 2016-02-01 | Disposition: A | Discharge status: Home / Self Care | Payer: Medicare Other | Attending: Emergency Medicine | Admitting: Emergency Medicine

## 2016-02-01 ENCOUNTER — Encounter (HOSPITAL_COMMUNITY): Payer: Self-pay | Admitting: Emergency Medicine

## 2016-02-01 DIAGNOSIS — Z79899 Other long term (current) drug therapy: Secondary | ICD-10-CM | POA: Insufficient documentation

## 2016-02-01 DIAGNOSIS — K0889 Other specified disorders of teeth and supporting structures: Secondary | ICD-10-CM | POA: Insufficient documentation

## 2016-02-01 DIAGNOSIS — K029 Dental caries, unspecified: Secondary | ICD-10-CM | POA: Insufficient documentation

## 2016-02-01 DIAGNOSIS — I10 Essential (primary) hypertension: Secondary | ICD-10-CM | POA: Diagnosis not present

## 2016-02-01 MED ORDER — PENICILLIN V POTASSIUM 500 MG PO TABS: 500.0000 mg | ORAL_TABLET | Freq: Four times a day (QID) | ORAL | 0 refills | Status: AC

## 2016-02-01 MED ORDER — BENZOCAINE 10 % MT GEL: 1.0000 "application " | OROMUCOSAL | 0 refills | Status: AC | PRN

## 2016-02-01 NOTE — Discharge Instructions (Signed)
Please take the antibiotics four times a day for seven day. Please use the orajel on your gums to help with the discomfort. Continue to take ibuprofen and Tylenol for pain. Follow-up with dental appointment on the 27th of this month.

## 2016-02-01 NOTE — ED Triage Notes (Signed)
Pt complaint of right upper tooth pain post teeth being pulled June 2017. Pt reports followed up with dentist "but they didn't do anything."

## 2016-02-01 NOTE — ED Notes (Signed)
Patient had 2 upper right teeth pulled in June of this year.  Patient states since that time she has had an ongoing sensation of fullness in her gums.  Patient states her gums are sensitive with she eats, but not painful.  Patient denies fever and N/V.  Patient has seen dentist several times related to this issue and was assured that nothing is wrong, yet patient's discomfort persists.    On exam, patient has 4 teeth total.  No gross edema or erythema noted.  Patient is not painful to external palpation.

## 2016-02-01 NOTE — ED Provider Notes (Signed)
[facility] DEPT Provider Note   CSN: [id number] Arrival date & time: 02/01/16  [id number]     History   Chief Complaint Chief Complaint  Patient presents with  . Dental Pain    HPI Adrienne Bright is a 82 y.o. female.  82 year old African-American female with a past medical history significant for hypertension presents to the ED today with dental pain. The patient had 2 upper right teeth pulled in June 2017. Patient states that since that time she has had ongoing sensation of fullness in her gums along with cold and hot sensitivity. Patient states the gums are sensitive when she eats but not painful. Patient states that she has seen a dentist several times since her teeth were pulled and was reassured that there is nothing wrong. Patient states that the discomfort persists. States that she has another appointment with the dentist on 02/11/2016. Patient has been trying Tylenol and ibuprofen at home with relief of her pain. Patient states the pain is constant and has not worsened. Patient denies any fever, nausea, vomiting, chills, facial swelling, headache, vision changes or any other symptoms.       Past Medical History:  Diagnosis Date  . Arthritis    ra  . Headache   . Hypercalcemia   . Hypertension   . Hypokalemia     Patient Active Problem List   Diagnosis Date Noted  . Chest pain with moderate risk for cardiac etiology 12/12/2014  . Meningioma ([place]) 11/05/2014  . Anxiety state 08/15/2006  . Essential hypertension 08/15/2006  . ALLERGIC RHINITIS 08/15/2006  . MENOPAUSAL DISORDER 08/15/2006  . ALOPECIA 08/15/2006  . SPONDYLOSIS, CERVICAL 08/15/2006  . Headache 08/15/2006    Past Surgical History:  Procedure Laterality Date  . CATARACT EXTRACTION    . NASAL SINUS SURGERY    . TUBAL LIGATION    . vocal cord nodule      OB History    No data available       Home Medications    Prior to Admission medications   Medication Sig Start Date End Date  Taking? Authorizing Provider  acetaminophen (TYLENOL) 325 MG tablet Take 2 tablets (650 mg total) by mouth every 6 (six) hours as needed for mild pain (or Fever >/= 101). 11/06/14   Liberty Handy, MD  Butalbital-APAP-Caffeine 50-300-40 MG CAPS Take 1 tablet by mouth as needed (migraines).  05/19/14   Historical Provider, MD  carvedilol (COREG) 25 MG tablet Take 1 tablet (25 mg total) by mouth 2 (two) times daily. 01/02/15   Skeet Latch, MD  diazepam (VALIUM) 5 MG tablet Take 2.5 mg by mouth 2 (two) times daily as needed for anxiety (anxiety).     Historical Provider, MD  dorzolamide (TRUSOPT) 2 % ophthalmic solution Place 1 drop into both eyes 2 (two) times daily. 05/19/14   Historical Provider, MD  furosemide (LASIX) 20 MG tablet Take 20 mg by mouth daily as needed for fluid.  11/14/14   Historical Provider, MD  KLOR-CON M20 20 MEQ tablet Take 20 mEq by mouth daily as needed (when taking furosemide).  10/14/14   Historical Provider, MD  latanoprost (XALATAN) 0.005 % ophthalmic solution Place 1 drop into both eyes at bedtime. 05/19/14   Historical Provider, MD  lisinopril (PRINIVIL,ZESTRIL) 40 MG tablet Take 40 mg by mouth daily. 05/05/14   Historical Provider, MD  loratadine (CLARITIN) 10 MG tablet Take 10 mg by mouth daily as needed for allergies.    Historical Provider, MD  meloxicam (MOBIC) 7.5  MG tablet Take 7.5 mg by mouth 2 (two) times daily as needed for pain (inflammation).  08/22/14   Historical Provider, MD  pravastatin (PRAVACHOL) 40 MG tablet Take 40 mg by mouth every other day.     Historical Provider, MD  traMADol (ULTRAM) 50 MG tablet Take 25 mg by mouth daily as needed. 10/02/14   Historical Provider, MD    Family History Family History  Problem Relation Age of Onset  . Stroke Mother   . CAD Neg Hx     Social History Social History  Substance Use Topics  . Smoking status: Never Smoker  . Smokeless tobacco: Current User    Types: Snuff  . Alcohol use No     Allergies     Propranolol hcl; Benazepril hcl; Amlodipine besylate; and Trandolapril   Review of Systems Review of Systems  Constitutional: Negative for chills and fever.  HENT: Positive for dental problem.   Gastrointestinal: Negative for nausea and vomiting.  Skin: Negative.   Neurological: Negative for dizziness, weakness, light-headedness, numbness and headaches.  All other systems reviewed and are negative.    Physical Exam Updated Vital Signs BP 156/84   Pulse (!) 57   Temp 98.2 F (36.8 C) (Oral)   Resp 18   SpO2 100%   Physical Exam  Constitutional: She is oriented to person, place, and time. She appears well-developed and well-nourished. No distress.  HENT:  Head: Normocephalic and atraumatic.  Right Ear: Tympanic membrane, external ear and ear canal normal.  Left Ear: Tympanic membrane, external ear and ear canal normal.  Nose: Nose normal.  Mouth/Throat: Uvula is midline, oropharynx is clear and moist and mucous membranes are normal.  Patient is a total of 4 teeth on the upper gums. There are no gross edema or erythema noted. Sockets appear normal. No tenderness to palpation of the gums themselves. No gross abscess noted. Patient has no trismus is able to open her mouth fully. No facial swelling appreciated. Several dental caries noted on teeth that are present. No sublingual or submandibular edema.  Neck: Normal range of motion. Neck supple.  Lymphadenopathy:    She has no cervical adenopathy.  Neurological: She is alert and oriented to person, place, and time.  Skin: Skin is warm and dry. Capillary refill takes less than 2 seconds.  Nursing note and vitals reviewed.    ED Treatments / Results  Labs (all labs ordered are listed, but only abnormal results are displayed) Labs Reviewed - No data to display  EKG  EKG Interpretation None       Radiology No results found.  Procedures Procedures (including critical care time)  Medications Ordered in ED Medications  - No data to display   Initial Impression / Assessment and Plan / ED Course  I have reviewed the triage vital signs and the nursing notes.  Pertinent labs & imaging results that were available during my care of the patient were reviewed by me and considered in my medical decision making (see chart for details).  Clinical Course   Patient with dental and gum pain.  No gross abscess.  Exam unconcerning for Ludwig's angina or spread of infection.  Will treat with penicillin and recommended tylenol and ibuprofen. Also encouraged use of orajel.  Urged patient to follow-up with dentist at her schedule appointment. Patient is non toxic appearing. Patient has no sign co mordity for delayed healing.  Pt is hemodynamically stable, in NAD, & able to ambulate in the ED. Pt has no  complaints prior to dc. Pt is comfortable with above plan and is stable for discharge at this time. All questions were answered prior to disposition. Strict return precautions for f/u to the ED were discussed. Patient discussed with Dr. Leonette Monarch who is agreeable to the above plan.    Final Clinical Impressions(s) / ED Diagnoses   Final diagnoses:  Pain, dental    New Prescriptions New Prescriptions   BENZOCAINE (ORAJEL) 10 % MUCOSAL GEL    Use as directed 1 application in the mouth or throat as needed for mouth pain.   PENICILLIN V POTASSIUM (VEETID) 500 MG TABLET    Take 1 tablet (500 mg total) by mouth 4 (four) times daily.     Doristine Devoid, PA-C 02/01/16 1115    Fatima Blank, MD 02/01/16 1630

## 2016-02-22 ENCOUNTER — Other Ambulatory Visit: Payer: Self-pay

## 2017-06-25 ENCOUNTER — Encounter (HOSPITAL_COMMUNITY): Payer: Self-pay | Admitting: *Deleted

## 2017-06-25 ENCOUNTER — Other Ambulatory Visit: Payer: Self-pay

## 2017-06-25 ENCOUNTER — Emergency Department (HOSPITAL_COMMUNITY)
Admission: EM | Admit: 2017-06-25 | Discharge: 2017-06-25 | Disposition: A | Discharge status: Home / Self Care | Payer: Medicare Other | Attending: Emergency Medicine | Admitting: Emergency Medicine

## 2017-06-25 DIAGNOSIS — Z79899 Other long term (current) drug therapy: Secondary | ICD-10-CM | POA: Insufficient documentation

## 2017-06-25 DIAGNOSIS — K0889 Other specified disorders of teeth and supporting structures: Secondary | ICD-10-CM | POA: Diagnosis present

## 2017-06-25 DIAGNOSIS — G8929 Other chronic pain: Secondary | ICD-10-CM

## 2017-06-25 DIAGNOSIS — I10 Essential (primary) hypertension: Secondary | ICD-10-CM | POA: Insufficient documentation

## 2017-06-25 DIAGNOSIS — K089 Disorder of teeth and supporting structures, unspecified: Secondary | ICD-10-CM

## 2017-06-25 DIAGNOSIS — R198 Other specified symptoms and signs involving the digestive system and abdomen: Secondary | ICD-10-CM | POA: Insufficient documentation

## 2017-06-25 MED ORDER — LIDOCAINE VISCOUS 2 % MT SOLN: 5.0000 mL | OROMUCOSAL | 0 refills | Status: AC | PRN

## 2017-06-25 NOTE — ED Triage Notes (Signed)
Pt states that she has had continued dental pain for 1 year after her dentist removed several of her lower teeth. Pt has followed up with her dentist multiple times.

## 2017-06-25 NOTE — ED Provider Notes (Signed)
[facility] EMERGENCY DEPARTMENT Provider Note   CSN: [id number] Arrival date & time: 06/25/17  1497     History   Chief Complaint Chief Complaint  Patient presents with  . Dental Problem    HPI Adrienne Bright is a 84 y.o. female.  HPI  Patient is an 84 year old female with a history of hypertension, hypercalcemia, headaches, and rheumatoid arthritis presenting for dental and gum pain.  Patient reports that over the past year, she has had several tooth extractions of posterior molars in bilateral upper and lower teeth.  Patient reports that approximately 5 days after her first extraction over a year ago, she developed gum discomfort, a sensation of hardening" underneath her gums, and feeling that the remaining teeth are "not hers".  Patient reports that it seems to be progressively getting worse, and she has discomfort with chewing due to painful gums.  Patient denies any redness of gums, masses, throat swelling, difficulty swallowing, difficulty breathing, lingual tenderness, submandibular tenderness, or neck tenderness.  Patient reports that she has tried salt water rinses, lukewarm water rinses, and hydrogen peroxide rinses for the discomfort.  Patient reports that she has had lowered appetite since she is dealt with these gum and tooth issues, and has been losing weight.  Patient reports that her primary care provider, who she saw 2-3 weeks ago, is aware that she has weight loss.  Past Medical History:  Diagnosis Date  . Arthritis    ra  . Headache   . Hypercalcemia   . Hypertension   . Hypokalemia     Patient Active Problem List   Diagnosis Date Noted  . Chest pain with moderate risk for cardiac etiology 12/12/2014  . Meningioma ([place]) 11/05/2014  . Anxiety state 08/15/2006  . Essential hypertension 08/15/2006  . ALLERGIC RHINITIS 08/15/2006  . MENOPAUSAL DISORDER 08/15/2006  . ALOPECIA 08/15/2006  . SPONDYLOSIS, CERVICAL 08/15/2006  . Headache  08/15/2006    Past Surgical History:  Procedure Laterality Date  . CATARACT EXTRACTION    . NASAL SINUS SURGERY    . TUBAL LIGATION    . vocal cord nodule       OB History   None      Home Medications    Prior to Admission medications   Medication Sig Start Date End Date Taking? Authorizing Provider  acetaminophen (TYLENOL) 325 MG tablet Take 2 tablets (650 mg total) by mouth every 6 (six) hours as needed for mild pain (or Fever >/= 101). 11/06/14   Liberty Handy, MD  benzocaine (ORAJEL) 10 % mucosal gel Use as directed 1 application in the mouth or throat as needed for mouth pain. 02/01/16   Doristine Devoid, PA-C  Butalbital-APAP-Caffeine 50-300-40 MG CAPS Take 1 tablet by mouth as needed (migraines).  05/19/14   [provider]  carvedilol (COREG) 25 MG tablet Take 1 tablet (25 mg total) by mouth 2 (two) times daily. 01/02/15   Skeet Latch, MD  diazepam (VALIUM) 5 MG tablet Take 2.5 mg by mouth 2 (two) times daily as needed for anxiety (anxiety).     [provider]  dorzolamide (TRUSOPT) 2 % ophthalmic solution Place 1 drop into both eyes 2 (two) times daily. 05/19/14   [provider]  furosemide (LASIX) 20 MG tablet Take 20 mg by mouth daily as needed for fluid.  11/14/14   [provider]  KLOR-CON M20 20 MEQ tablet Take 20 mEq by mouth daily as needed (when taking furosemide).  10/14/14  [provider]  latanoprost (XALATAN) 0.005 % ophthalmic solution Place 1 drop into both eyes at bedtime. 05/19/14   [provider]  lisinopril (PRINIVIL,ZESTRIL) 40 MG tablet Take 40 mg by mouth daily. 05/05/14   [provider]  loratadine (CLARITIN) 10 MG tablet Take 10 mg by mouth daily as needed for allergies.    [provider]  meloxicam (MOBIC) 7.5 MG tablet Take 7.5 mg by mouth 2 (two) times daily as needed for pain (inflammation).  08/22/14   [provider]  pravastatin (PRAVACHOL) 40 MG tablet Take 40  mg by mouth every other day.     [provider]  traMADol (ULTRAM) 50 MG tablet Take 25 mg by mouth daily as needed. 10/02/14   [provider]    Family History Family History  Problem Relation Age of Onset  . Stroke Mother   . CAD Neg Hx     Social History Social History   Tobacco Use  . Smoking status: Never Smoker  . Smokeless tobacco: Current User    Types: Snuff  Substance Use Topics  . Alcohol use: No  . Drug use: No     Allergies   Propranolol hcl; Benazepril hcl; Amlodipine besylate; and Trandolapril   Review of Systems Review of Systems  Constitutional: Positive for unexpected weight change. Negative for chills and fever.  HENT: Positive for dental problem. Negative for facial swelling, sore throat and trouble swallowing.   Respiratory: Negative for shortness of breath and stridor.      Physical Exam Updated Vital Signs BP (!) 161/88   Pulse (!) 51   Temp 98.2 F (36.8 C) (Oral)   Resp 16   SpO2 98%   Physical Exam  Constitutional: She appears well-developed and well-nourished. No distress.  Sitting comfortably in bed.  HENT:  Head: Normocephalic and atraumatic.  Dental cavities and poor oral dentition noted. Pain bilateral lower posterior gums. No abscess noted. Midline uvula. No trismus. OP clear and moist. No oropharyngeal erythema or edema. Neck supple with no tenderness. No facial edema.  Eyes: Conjunctivae are normal. Right eye exhibits no discharge. Left eye exhibits no discharge.  EOMs normal to gross examination.  Neck: Normal range of motion.  Cardiovascular: Normal rate, regular rhythm and normal heart sounds.  No murmur heard. Bradycardia noted.   Pulmonary/Chest:  Normal respiratory effort. Patient converses comfortably. No audible wheeze or stridor.  Abdominal: She exhibits no distension.  Musculoskeletal: Normal range of motion.  Neurological: She is alert.  Cranial nerves intact to gross observation. Patient  moves extremities without difficulty.  Skin: Skin is warm and dry. She is not diaphoretic.  Psychiatric: She has a normal mood and affect. Her behavior is normal. Judgment and thought content normal.  Nursing note and vitals reviewed.    ED Treatments / Results  Labs (all labs ordered are listed, but only abnormal results are displayed) Labs Reviewed - No data to display  EKG None  Radiology No results found.  Procedures Procedures (including critical care time)  Medications Ordered in ED Medications - No data to display   Initial Impression / Assessment and Plan / ED Course  I have reviewed the triage vital signs and the nursing notes.  Pertinent labs & imaging results that were available during my care of the patient were reviewed by me and considered in my medical decision making (see chart for details).     Patient is nontoxic-appearing, afebrile, airway intact, and in no acute distress at  rest.  Patient with abnormal sensations and discomfort of the gums and remaining T secondary to tooth extractions over the past year.  I spoke with Dr. Mariane Duval, patient's personal oral surgeon who performed his extractions regarding the patient's symptoms, who recommends that she follows up at the office.  Per Dr. Hoyt Koch, patient has had multiple visits, and examinations and radiography have been without acute abnormality.  Given that patient has gum discomfort, will proceed with viscous lidocaine.   Patient was independently evaluated by Dr. Dene Gentry, who agrees that there is no infectious etiology, and antibiotics not warranted at this time.  Patient to follow-up as soon as possible Dr. Hoyt Koch.  Patient can return precautions for any oral swelling, lingual swelling, neck tenderness, or swelling of the back of the throat.  Of note, patient is bradycardic today.  Patient is on carvedilol.  Patient is asymptomatic of this pulse.  Final Clinical Impressions(s) / ED Diagnoses    Final diagnoses:  Chronic dental pain  Gum symptoms    ED Discharge Orders        Ordered    lidocaine (XYLOCAINE) 2 % solution  As needed     06/25/17 1146       Tamala Julian 06/25/17 1146    Valarie Merino, MD 06/25/17 1607

## 2017-06-25 NOTE — Discharge Instructions (Signed)
Please see the information and instructions below regarding your visit.  Your diagnoses today include:  1. Chronic dental pain   2. Gum symptoms    You have a dental discofmort It is very important that you get evaluated by a dentist as soon as possible. Call tomorrow to schedule an appointment.   Tests performed today include: See side panel of your discharge paperwork for testing performed today. Vital signs are listed at the bottom of these instructions.   Medications prescribed:     Use 5 mL of lidocaine swish and spit as needed for gum discomfort.  Do not swallow.  Do not exceed 4 administrations in 1 day.  Home care instructions:  Please follow any educational materials contained in this packet.   Eat a soft or liquid diet and rinse your mouth out after meals with warm water. You should see a dentist or return here at once if you have increased swelling, increased pain or uncontrolled bleeding from the site of your injury.  Follow-up instructions: It is very important that you see a dentist as soon as possible. There is a list of dentists attached to this packet if you do not have care established with a dentist already. Please give a call to a dentist of your choice tomorrow.  Return instructions:  Please return to the Emergency Department if you experience worsening symptoms.  Please seek care if you note any of the following about your dental pain:  You have increased pain not controlled with medicines.  You have swelling around your tooth, in your face or neck.  You have bleeding which starts, continues, or gets worse.  You have a fever >101 If you are unable to open your mouth Please return if you have any other emergent concerns.  Additional Information:   Your vital signs today were: BP (!) 161/88    Pulse (!) 51    Temp 98.2 F (36.8 C) (Oral)    Resp 16    SpO2 98%  If your blood pressure (BP) was elevated on multiple readings during this visit above 130 for  the top number or above 80 for the bottom number, please have this repeated by your primary care provider within one month. --------------  Thank you for allowing [place] to participate in your care today.

## 2018-05-21 ENCOUNTER — Ambulatory Visit: Payer: Self-pay | Admitting: Family Medicine

## 2021-04-02 ENCOUNTER — Other Ambulatory Visit: Payer: Self-pay | Admitting: Internal Medicine

## 2021-04-03 LAB — COMPLETE METABOLIC PANEL WITH GFR
AG Ratio: 1.9 (calc) (ref 1.0–2.5)
ALT: 10 U/L (ref 6–29)
AST: 15 U/L (ref 10–35)
Albumin: 4.8 g/dL (ref 3.6–5.1)
Alkaline phosphatase (APISO): 44 U/L (ref 37–153)
BUN/Creatinine Ratio: 12 (calc) (ref 6–22)
BUN: 12 mg/dL (ref 7–25)
CO2: 28 mmol/L (ref 20–32)
Calcium: 10.3 mg/dL (ref 8.6–10.4)
Chloride: 94 mmol/L — ABNORMAL LOW (ref 98–110)
Creat: 1.01 mg/dL — ABNORMAL HIGH (ref 0.60–0.95)
Globulin: 2.5 g/dL (calc) (ref 1.9–3.7)
Glucose, Bld: 79 mg/dL (ref 65–99)
Potassium: 3.4 mmol/L — ABNORMAL LOW (ref 3.5–5.3)
Sodium: 133 mmol/L — ABNORMAL LOW (ref 135–146)
Total Bilirubin: 0.4 mg/dL (ref 0.2–1.2)
Total Protein: 7.3 g/dL (ref 6.1–8.1)
eGFR: 53 mL/min/{1.73_m2} — ABNORMAL LOW (ref >=60)

## 2021-04-03 LAB — LIPID PANEL
Cholesterol: 284 mg/dL — ABNORMAL HIGH (ref <200)
HDL: 107 mg/dL (ref >=50)
LDL Cholesterol (Calc): 148 mg/dL (calc) — ABNORMAL HIGH
Non-HDL Cholesterol (Calc): 177 mg/dL (calc) — ABNORMAL HIGH (ref <130)
Total CHOL/HDL Ratio: 2.7 (calc) (ref <5.0)
Triglycerides: 154 mg/dL — ABNORMAL HIGH (ref <150)

## 2021-04-03 LAB — CBC
HCT: 37.8 % (ref 35.0–45.0)
Hemoglobin: 12.7 g/dL (ref 11.7–15.5)
MCH: 30.8 pg (ref 27.0–33.0)
MCHC: 33.6 g/dL (ref 32.0–36.0)
MCV: 91.5 fL (ref 80.0–100.0)
MPV: 10.9 fL (ref 7.5–12.5)
Platelets: 304 10*3/uL (ref 140–400)
RBC: 4.13 10*6/uL (ref 3.80–5.10)
RDW: 12.8 % (ref 11.0–15.0)
WBC: 5.9 10*3/uL (ref 3.8–10.8)

## 2021-04-03 LAB — T4, FREE: Free T4: 1.2 ng/dL (ref 0.8–1.8)

## 2021-04-03 LAB — TSH: TSH: 1.78 mIU/L (ref 0.40–4.50)

## 2021-04-25 ENCOUNTER — Ambulatory Visit (HOSPITAL_COMMUNITY)
Admission: EM | Admit: 2021-04-25 | Discharge: 2021-04-25 | Disposition: A | Discharge status: Home / Self Care | Payer: Medicare Other | Attending: Family Medicine | Admitting: Family Medicine

## 2021-04-25 ENCOUNTER — Encounter (HOSPITAL_COMMUNITY): Payer: Self-pay

## 2021-04-25 DIAGNOSIS — R509 Fever, unspecified: Secondary | ICD-10-CM | POA: Diagnosis not present

## 2021-04-25 DIAGNOSIS — K068 Other specified disorders of gingiva and edentulous alveolar ridge: Secondary | ICD-10-CM

## 2021-04-25 DIAGNOSIS — R519 Headache, unspecified: Secondary | ICD-10-CM | POA: Diagnosis not present

## 2021-04-25 NOTE — ED Provider Notes (Signed)
[facility]    CSN: [id number] Arrival date & time: 04/25/21  0847      History   Chief Complaint No chief complaint on file.   HPI Adrienne Bright is a 88 y.o. female.   Patient is here for right gum pain.  She had teeth pulled several years ago.  She has had pain/discomfort since then, and seen several dentists that cannot tell her the problem.  She had a fever the night before last, she did not check her temp, but felt warm.  Temp off/on since then, but has not checked her temp.  Having stiffness in her neck when she turns her head from side to side, that started about 1 week ago.  No runny nose, congestion, drainage.  No ear pain, but pain at the right side of her head.  The gums make her whole right side painful.  No n/v.   Eating/drinking normal.  No urinary symptoms noted.   Past Medical History:  Diagnosis Date   Arthritis    ra   Headache    Hypercalcemia    Hypertension    Hypokalemia     Patient Active Problem List   Diagnosis Date Noted   Chest pain with moderate risk for cardiac etiology 12/12/2014   Meningioma ([place]) 11/05/2014   Anxiety state 08/15/2006   Essential hypertension 08/15/2006   ALLERGIC RHINITIS 08/15/2006   MENOPAUSAL DISORDER 08/15/2006   ALOPECIA 08/15/2006   SPONDYLOSIS, CERVICAL 08/15/2006   Headache 08/15/2006    Past Surgical History:  Procedure Laterality Date   CATARACT EXTRACTION     NASAL SINUS SURGERY     TUBAL LIGATION     vocal cord nodule      OB History   No obstetric history on file.      Home Medications    Prior to Admission medications   Medication Sig Start Date End Date Taking? Authorizing Provider  acetaminophen (TYLENOL) 325 MG tablet Take 2 tablets (650 mg total) by mouth every 6 (six) hours as needed for mild pain (or Fever >/= 101). 11/06/14   Liberty Handy, MD  benzocaine (ORAJEL) 10 % mucosal gel Use as directed 1 application in the mouth or throat as needed for mouth pain. 02/01/16    Doristine Devoid, PA-C  Butalbital-APAP-Caffeine 50-300-40 MG CAPS Take 1 tablet by mouth as needed (migraines).  05/19/14   [provider]  carvedilol (COREG) 25 MG tablet Take 1 tablet (25 mg total) by mouth 2 (two) times daily. 01/02/15   Skeet Latch, MD  diazepam (VALIUM) 5 MG tablet Take 2.5 mg by mouth 2 (two) times daily as needed for anxiety (anxiety).     [provider]  dorzolamide (TRUSOPT) 2 % ophthalmic solution Place 1 drop into both eyes 2 (two) times daily. 05/19/14   [provider]  furosemide (LASIX) 20 MG tablet Take 20 mg by mouth daily as needed for fluid.  11/14/14   [provider]  KLOR-CON M20 20 MEQ tablet Take 20 mEq by mouth daily as needed (when taking furosemide).  10/14/14   [provider]  latanoprost (XALATAN) 0.005 % ophthalmic solution Place 1 drop into both eyes at bedtime. 05/19/14   [provider]  lidocaine (XYLOCAINE) 2 % solution Use as directed 5 mLs in the mouth or throat as needed for mouth pain. Swish and spit.  Do not swallow. 06/25/17   Langston Masker B, PA-C  lisinopril (PRINIVIL,ZESTRIL) 40 MG tablet Take 40 mg by  mouth daily. 05/05/14   [provider]  loratadine (CLARITIN) 10 MG tablet Take 10 mg by mouth daily as needed for allergies.    [provider]  meloxicam (MOBIC) 7.5 MG tablet Take 7.5 mg by mouth 2 (two) times daily as needed for pain (inflammation).  08/22/14   [provider]  pravastatin (PRAVACHOL) 40 MG tablet Take 40 mg by mouth every other day.     [provider]  traMADol (ULTRAM) 50 MG tablet Take 25 mg by mouth daily as needed. 10/02/14   [provider]    Family History Family History  Problem Relation Age of Onset   Stroke Mother    CAD Neg Hx     Social History Social History   Tobacco Use   Smoking status: Never   Smokeless tobacco: Current    Types: Snuff  Substance Use Topics   Alcohol use: No   Drug use:  No     Allergies   Propranolol hcl, Benazepril hcl, Amlodipine besylate, and Trandolapril   Review of Systems Review of Systems  Constitutional:  Positive for fever. Negative for activity change, appetite change and chills.  HENT:  Positive for dental problem. Negative for rhinorrhea, sinus pressure and tinnitus.   Eyes: Negative.   Respiratory: Negative.    Cardiovascular: Negative.   Gastrointestinal: Negative.   Genitourinary: Negative.     Physical Exam Triage Vital Signs ED Triage Vitals [04/25/21 0951]  Enc Vitals Group     BP (!) 143/83     Pulse Rate 96     Resp 16     Temp 98.7 F (37.1 C)     Temp Source Oral     SpO2 96 %     Weight      Height      Head Circumference      Peak Flow      Pain Score 5     Pain Loc      Pain Edu?      Excl. in [place]?    No data found.  Updated Vital Signs BP (!) 143/83 (BP Location: Left Arm)    Pulse 96    Temp 98.7 F (37.1 C) (Oral)    Resp 16    SpO2 96%   Visual Acuity Right Eye Distance:   Left Eye Distance:   Bilateral Distance:    Right Eye Near:   Left Eye Near:    Bilateral Near:     Physical Exam Constitutional:      Appearance: Normal appearance.  HENT:     Head: Normocephalic and atraumatic.     Right Ear: Tympanic membrane normal.     Left Ear: Tympanic membrane normal.     Nose: Nose normal.     Mouth/Throat:     Pharynx: No oropharyngeal exudate or posterior oropharyngeal erythema.  Cardiovascular:     Rate and Rhythm: Normal rate and regular rhythm.  Pulmonary:     Effort: Pulmonary effort is normal.     Breath sounds: Normal breath sounds.  Abdominal:     Palpations: Abdomen is soft.  Musculoskeletal:     Cervical back: Normal range of motion and neck supple. No tenderness.  Neurological:     Mental Status: She is alert.     UC Treatments / Results  Labs (all labs ordered are listed, but only abnormal results are displayed) Labs Reviewed - No data to  display  EKG   Radiology No results found.  Procedures Procedures (including critical care time)  Medications Ordered in UC Medications - No data to display  Initial Impression / Assessment and Plan / UC Course  I have reviewed the triage vital signs and the nursing notes.  Pertinent labs & imaging results that were available during my care of the patient were reviewed by me and considered in my medical decision making (see chart for details).   Patient was seen today for chronic gum/head pain, and "fever" several days ago, although she never checked her temp.  Gum symptoms are unchanged overall.  Exam was normal.  Discussion that I see no signs of bacterial infection at this time.  She should follow up and discuss her ongoing gum/head pain with her pcp, which she is aware of and agrees with.   Final Clinical Impressions(s) / UC Diagnoses   Final diagnoses:  Pain in gums  Nonintractable headache, unspecified chronicity pattern, unspecified headache type  Fever, unspecified     Discharge Instructions      You were seen today for chronic dental/gum pain, along with head pain, and feeling feverish several night ago.  Today, your exam looks good.  No gum pain with palpation, no swelling.  The rest of your exam does not show any signs of infection.  As discussed, you may wish to follow up with your primary care provider for further discussion about your chronic gum and head pain.      ED Prescriptions   None    PDMP not reviewed this encounter.   Rondel Oh, MD 04/25/21 1021

## 2021-04-25 NOTE — ED Triage Notes (Signed)
Pt presents for gum pain x 1 year. She reports having some dental work completed several years ago.

## 2021-04-25 NOTE — Discharge Instructions (Addendum)
You were seen today for chronic dental/gum pain, along with head pain, and feeling feverish several night ago.  Today, your exam looks good.  No gum pain with palpation, no swelling.  The rest of your exam does not show any signs of infection.  As discussed, you may wish to follow up with your primary care provider for further discussion about your chronic gum and head pain.
